# Patient Record
Sex: Male | Born: 1990 | Race: White | Hispanic: No | Smoking: Never smoker
Health system: Southern US, Community
[De-identification: ages and names within clinical notes are randomized; demographics above are authoritative.]

## PROBLEM LIST (undated history)

## (undated) DIAGNOSIS — J039 Acute tonsillitis, unspecified: Secondary | ICD-10-CM

## (undated) DIAGNOSIS — F909 Attention-deficit hyperactivity disorder, unspecified type: Secondary | ICD-10-CM

## (undated) DIAGNOSIS — G43909 Migraine, unspecified, not intractable, without status migrainosus: Secondary | ICD-10-CM

## (undated) DIAGNOSIS — M549 Dorsalgia, unspecified: Secondary | ICD-10-CM

## (undated) HISTORY — PX: APPENDECTOMY: SHX54

---

## 2011-11-29 DIAGNOSIS — R319 Hematuria, unspecified: Secondary | ICD-10-CM | POA: Insufficient documentation

## 2013-02-15 DIAGNOSIS — K37 Unspecified appendicitis: Secondary | ICD-10-CM | POA: Insufficient documentation

## 2013-10-11 DIAGNOSIS — M545 Low back pain, unspecified: Secondary | ICD-10-CM | POA: Insufficient documentation

## 2013-10-12 DIAGNOSIS — M5137 Other intervertebral disc degeneration, lumbosacral region: Secondary | ICD-10-CM | POA: Insufficient documentation

## 2019-07-29 ENCOUNTER — Other Ambulatory Visit: Payer: Self-pay

## 2019-07-29 ENCOUNTER — Ambulatory Visit (HOSPITAL_COMMUNITY)
Admission: EM | Admit: 2019-07-29 | Discharge: 2019-07-29 | Disposition: A | Discharge status: Home / Self Care | Payer: Managed Care, Other (non HMO) | Attending: Internal Medicine | Admitting: Internal Medicine

## 2019-07-29 ENCOUNTER — Encounter (HOSPITAL_COMMUNITY): Payer: Self-pay

## 2019-07-29 DIAGNOSIS — R0789 Other chest pain: Secondary | ICD-10-CM

## 2019-07-29 DIAGNOSIS — S238XXA Sprain of other specified parts of thorax, initial encounter: Secondary | ICD-10-CM

## 2019-07-29 DIAGNOSIS — X500XXA Overexertion from strenuous movement or load, initial encounter: Secondary | ICD-10-CM

## 2019-07-29 MED ORDER — CYCLOBENZAPRINE HCL 10 MG PO TABS: 10.0000 mg | ORAL_TABLET | Freq: Two times a day (BID) | ORAL | 0 refills | Status: DC | PRN

## 2019-07-29 MED ORDER — KETOROLAC TROMETHAMINE 60 MG/2ML IM SOLN
INTRAMUSCULAR | Status: AC
  Filled 2019-07-29: qty 2

## 2019-07-29 MED ORDER — HYDROCODONE-ACETAMINOPHEN 5-325 MG PO TABS: 1.0000 | ORAL_TABLET | Freq: Four times a day (QID) | ORAL | 0 refills | Status: AC | PRN

## 2019-07-29 NOTE — ED Triage Notes (Signed)
Pt states she was moving some stuff in his garage and he started to feel chest pains. The pain started inside of his right underarm and moved into his back and his chest this started yesterday.

## 2019-07-29 NOTE — ED Provider Notes (Addendum)
[facility]    CSN: [id number] Arrival date & time: 07/29/19  1528      History   Chief Complaint Chief Complaint  Patient presents with  . Chest Pain    HPI Eric Cowan is a 28 y.o. male comes to urgent care with 1 day history of right-sided chest pain.  Patient carried her deep freezer last night into his house.  He had slight right arm pain.  Over the course of the night and for the most part of the day the pain worsened in intensity.  Pain is currently 10 out of 10, sharp, constant, no relieving factors.  It is aggravated by movement.  Pain radiates around the neck on the right side as well as around the back.  He has not tried any over-the-counter medications. Marland Kitchen   HPI  History reviewed. No pertinent past medical history.  There are no problems to display for this patient.   History reviewed. No pertinent surgical history.     Home Medications    Prior to Admission medications   Medication Sig Start Date End Date Taking? Authorizing Provider  cyclobenzaprine (FLEXERIL) 10 MG tablet Take 1 tablet (10 mg total) by mouth 2 (two) times daily as needed for muscle spasms. 07/29/19   LampteyMyrene Galas, MD  HYDROcodone-acetaminophen (NORCO/VICODIN) 5-325 MG tablet Take 1 tablet by mouth every 6 (six) hours as needed for up to 5 days for moderate pain or severe pain. 07/29/19 08/03/19  Chase Picket, MD    Family History History reviewed. No pertinent family history.  Social History Social History   Tobacco Use  . Smoking status: Never Smoker  . Smokeless tobacco: Never Used  Substance Use Topics  . Alcohol use: Yes  . Drug use: Never     Allergies   Patient has no known allergies.   Review of Systems Review of Systems  Constitutional: Positive for activity change. Negative for chills, fatigue and fever.  HENT: Negative.   Eyes: Negative.   Respiratory: Positive for shortness of breath. Negative for cough and chest tightness.     Cardiovascular: Positive for chest pain. Negative for palpitations.  Gastrointestinal: Negative for nausea and vomiting.  Musculoskeletal: Positive for arthralgias, myalgias and neck pain. Negative for neck stiffness.  Skin: Negative.  Negative for rash and wound.  Neurological: Negative for dizziness, light-headedness and headaches.  Psychiatric/Behavioral: Negative for confusion and decreased concentration.     Physical Exam Triage Vital Signs ED Triage Vitals  Enc Vitals Group     BP 07/29/19 1609 (!) 144/116     Pulse Rate 07/29/19 1609 (!) 128     Resp 07/29/19 1609 18     Temp 07/29/19 1609 98.9 F (37.2 C)     Temp Source 07/29/19 1609 Oral     SpO2 07/29/19 1609 100 %     Weight 07/29/19 1604 234 lb (106.1 kg)     Height --      Head Circumference --      Peak Flow --      Pain Score 07/29/19 1603 10     Pain Loc --      Pain Edu? --      Excl. in [place]? --    No data found.  Updated Vital Signs BP (!) 144/116 (BP Location: Right Arm)   Pulse (!) 128   Temp 98.9 F (37.2 C) (Oral)   Resp 18   Wt 106.1 kg   SpO2 100%   Visual  Acuity Right Eye Distance:   Left Eye Distance:   Bilateral Distance:    Right Eye Near:   Left Eye Near:    Bilateral Near:     Physical Exam Vitals and nursing note reviewed.  Constitutional:      General: He is in acute distress.     Appearance: He is not ill-appearing or diaphoretic.  HENT:     Head: Normocephalic and atraumatic.  Neck:     Vascular: No hepatojugular reflux.     Trachea: No tracheal deviation.  Cardiovascular:     Rate and Rhythm: Normal rate and regular rhythm.  Pulmonary:     Effort: No tachypnea or respiratory distress.     Breath sounds: Normal breath sounds. No decreased breath sounds, wheezing, rhonchi or rales.  Abdominal:     General: Bowel sounds are normal. There is no abdominal bruit.     Palpations: There is no hepatomegaly or splenomegaly.  Musculoskeletal:        General: Normal range of  motion.     Cervical back: Normal range of motion.     Right lower leg: No edema.     Left lower leg: No edema.  Lymphadenopathy:     Cervical: No cervical adenopathy.  Skin:    General: Skin is warm.     Capillary Refill: Capillary refill takes less than 2 seconds.     Coloration: Skin is not cyanotic or pale.  Neurological:     General: No focal deficit present.     Mental Status: He is alert.      UC Treatments / Results  Labs (all labs ordered are listed, but only abnormal results are displayed) Labs Reviewed - No data to display  EKG   Radiology No results found.  Procedures Procedures (including critical care time)  Medications Ordered in UC Medications - No data to display  Initial Impression / Assessment and Plan / UC Course  I have reviewed the triage vital signs and the nursing notes.  Pertinent labs & imaging results that were available during my care of the patient were reviewed by me and considered in my medical decision making (see chart for details).     1.  Chest wall sprain: Patient sprained his pectoralis muscle from the heavy lifting. Toradol 60 mg IM x1 dose Hydrocodone 5-325 mg 1 tablet every 6 hours as needed for pain Flexeril 10 mg twice daily as needed for muscle spasms Icing of the pectoralis muscle Gentle range of motion exercises If symptoms worsen patient is welcome to return to urgent care to be evaluated.  2.  Elevated blood pressure secondary to severe pain: Pain control as above Repeat blood pressure was 135/109. Final Clinical Impressions(s) / UC Diagnoses   Final diagnoses:  Sprain of chest wall, initial encounter   Discharge Instructions   None    ED Prescriptions    Medication Sig Dispense Auth. Provider   cyclobenzaprine (FLEXERIL) 10 MG tablet Take 1 tablet (10 mg total) by mouth 2 (two) times daily as needed for muscle spasms. 30 tablet Juston Goheen, Britta Mccreedy, MD   HYDROcodone-acetaminophen (NORCO/VICODIN) 5-325 MG  tablet Take 1 tablet by mouth every 6 (six) hours as needed for up to 5 days for moderate pain or severe pain. 20 tablet Yocelyn Brocious, Britta Mccreedy, MD     I have reviewed the PDMP during this encounter.   Merrilee Jansky, MD 07/29/19 1719    Merrilee Jansky, MD 07/29/19 [id number]

## 2019-08-02 ENCOUNTER — Ambulatory Visit: Payer: Managed Care, Other (non HMO) | Attending: Internal Medicine

## 2019-08-02 DIAGNOSIS — Z20822 Contact with and (suspected) exposure to covid-19: Secondary | ICD-10-CM

## 2019-08-03 ENCOUNTER — Other Ambulatory Visit: Payer: Managed Care, Other (non HMO)

## 2019-08-03 LAB — NOVEL CORONAVIRUS, NAA: SARS-CoV-2, NAA: DETECTED — AB

## 2019-09-22 DIAGNOSIS — J039 Acute tonsillitis, unspecified: Secondary | ICD-10-CM | POA: Insufficient documentation

## 2019-09-27 ENCOUNTER — Ambulatory Visit (INDEPENDENT_AMBULATORY_CARE_PROVIDER_SITE_OTHER): Payer: Managed Care, Other (non HMO)

## 2019-09-27 ENCOUNTER — Ambulatory Visit (HOSPITAL_COMMUNITY)
Admission: EM | Admit: 2019-09-27 | Discharge: 2019-09-27 | Disposition: A | Discharge status: Home / Self Care | Payer: Managed Care, Other (non HMO) | Attending: Family Medicine | Admitting: Family Medicine

## 2019-09-27 ENCOUNTER — Encounter (HOSPITAL_COMMUNITY): Payer: Self-pay

## 2019-09-27 ENCOUNTER — Other Ambulatory Visit: Payer: Self-pay

## 2019-09-27 DIAGNOSIS — S93402A Sprain of unspecified ligament of left ankle, initial encounter: Secondary | ICD-10-CM

## 2019-09-27 HISTORY — DX: Acute tonsillitis, unspecified: J03.90

## 2019-09-27 MED ORDER — IBUPROFEN 800 MG PO TABS
800.0000 mg | ORAL_TABLET | Freq: Once | ORAL | Status: AC
  Administered 2019-09-27: 800 mg via ORAL

## 2019-09-27 MED ORDER — IBUPROFEN 800 MG PO TABS
ORAL_TABLET | ORAL | Status: AC
  Filled 2019-09-27: qty 1

## 2019-09-27 NOTE — ED Triage Notes (Signed)
Pt states while walking, his foot slipped off sidewalk into drainage ditch area and he twisted his left ankle, fell and heard a "snap and pop" in his ankle. Abrasion to right hand and knee. Left ankle edematous with limited ROM.

## 2019-09-27 NOTE — ED Provider Notes (Signed)
[facility]    CSN: [id number] Arrival date & time: 09/27/19  1300      History   Chief Complaint Chief Complaint  Patient presents with  . Ankle Pain    HPI Eric Cowan is a 29 y.o. male no significant past medical history presenting today for evaluation of left ankle injury.  Patient was walking, stepped off of a curb and twisted his left ankle.  Felt a popping sensation at the time.  Injury caused him to fall and he landed on his right knee and hand.  He sustained abrasions to these areas, but denies any difficulty bending knee or any wrist pain.  His main concern is his left ankle, has developed significant pain with weightbearing as well as swelling.  Accident happened earlier today.  Denies prior injury to ankle/foot.  Does have slight tingling sensation.  Reports limited range of motion.  HPI  Past Medical History:  Diagnosis Date  . Tonsillitis     There are no problems to display for this patient.   History reviewed. No pertinent surgical history.     Home Medications    Prior to Admission medications   Medication Sig Start Date End Date Taking? Authorizing Provider  atorvastatin (LIPITOR) 10 MG tablet Take by mouth. 02/09/19  Yes [provider]  clindamycin (CLEOCIN) 150 MG capsule  09/23/19  Yes [provider]  predniSONE (STERAPRED UNI-PAK 21 TAB) 5 MG (21) TBPK tablet USE AS DIRECTED PER PACKAGE INSTRUCTIONS 08/02/19  Yes [provider]    Family History History reviewed. No pertinent family history.  Social History Social History   Tobacco Use  . Smoking status: Never Smoker  . Smokeless tobacco: Never Used  Substance Use Topics  . Alcohol use: Yes  . Drug use: Never     Allergies   Patient has no known allergies.   Review of Systems Review of Systems  Constitutional: Negative for fatigue and fever.  Eyes: Negative for redness, itching and visual disturbance.  Respiratory: Negative for  shortness of breath.   Cardiovascular: Negative for chest pain and leg swelling.  Gastrointestinal: Negative for nausea and vomiting.  Musculoskeletal: Positive for arthralgias, gait problem and joint swelling. Negative for myalgias.  Skin: Positive for wound. Negative for color change and rash.  Neurological: Negative for dizziness, syncope, weakness, light-headedness and headaches.     Physical Exam Triage Vital Signs ED Triage Vitals  Enc Vitals Group     BP 09/27/19 1357 123/86     Pulse Rate 09/27/19 1357 90     Resp 09/27/19 1357 18     Temp 09/27/19 1357 98.2 F (36.8 C)     Temp Source 09/27/19 1357 Oral     SpO2 09/27/19 1357 98 %     Weight --      Height --      Head Circumference --      Peak Flow --      Pain Score 09/27/19 1353 6     Pain Loc --      Pain Edu? --      Excl. in [place]? --    No data found.  Updated Vital Signs BP 123/86 (BP Location: Right Arm)   Pulse 90   Temp 98.2 F (36.8 C) (Oral)   Resp 18   SpO2 98%   Visual Acuity Right Eye Distance:   Left Eye Distance:   Bilateral Distance:    Right Eye Near:   Left Eye Near:  Bilateral Near:     Physical Exam Vitals and nursing note reviewed.  Constitutional:      Appearance: He is well-developed.     Comments: No acute distress  HENT:     Head: Normocephalic and atraumatic.     Nose: Nose normal.  Eyes:     Conjunctiva/sclera: Conjunctivae normal.  Cardiovascular:     Rate and Rhythm: Normal rate.  Pulmonary:     Effort: Pulmonary effort is normal. No respiratory distress.  Abdominal:     General: There is no distension.  Musculoskeletal:        General: Normal range of motion.     Cervical back: Neck supple.     Comments: Left ankle: Moderate swelling about lateral malleolus extending anteriorly and to proximal lateral dorsum of foot Nontender to medial malleolus, minimal tenderness over Achilles, negative Thompson's Nontender to palpation of distal dorsum of foot along the  distal metatarsals, dorsalis pedis 2+ Limited dorsiflexion, relatively full plantar flexion Nontender to palpation of fibular insertion at the left knee  Skin:    General: Skin is warm and dry.     Comments: Superficial abrasions noted to right knee and right palm/wrist  Neurological:     Mental Status: He is alert and oriented to person, place, and time.      UC Treatments / Results  Labs (all labs ordered are listed, but only abnormal results are displayed) Labs Reviewed - No data to display  EKG   Radiology DG Ankle Complete Left  Result Date: 09/27/2019 CLINICAL DATA:  Pain and swelling. Painful range of motion. The patient suffered a twisting injury today. EXAM: LEFT ANKLE COMPLETE - 3+ VIEW COMPARISON:  None. FINDINGS: There is no evidence of fracture, dislocation, or joint effusion. There is no evidence of arthropathy or other focal bone abnormality. Slight soft tissue swelling over the lateral malleolus. IMPRESSION: Soft tissue swelling over the lateral malleolus. Electronically Signed   By: Lorriane Shire M.D.   On: 09/27/2019 14:25    Procedures Procedures (including critical care time)  Medications Ordered in UC Medications  ibuprofen (ADVIL) tablet 800 mg (800 mg Oral Given 09/27/19 1414)    Initial Impression / Assessment and Plan / UC Course  I have reviewed the triage vital signs and the nursing notes.  Pertinent labs & imaging results that were available during my care of the patient were reviewed by me and considered in my medical decision making (see chart for details).    1.  Left ankle sprain X-ray negative for acute fracture.  Most likely sprain, but discussed with patient cannot rule out underlying ligamentous/tendon damage.  Will treat as sprain with ASO, crutches, weight-bear as tolerated, anti-inflammatories ice and elevation.  If not regaining normal range of motion and improvement in pain and swelling over the next couple weeks to follow-up with sports  medicine.  Discussed strict return precautions. Patient verbalized understanding and is agreeable with plan.  Final Clinical Impressions(s) / UC Diagnoses   Final diagnoses:  Sprain of left ankle, unspecified ligament, initial encounter     Discharge Instructions     No fracture Ice and elevate Use anti-inflammatories for pain/swelling. You may take up to 800 mg Ibuprofen every 8 hours with food. You may supplement Ibuprofen with Tylenol [phone] mg every 8 hours.   Weight bear as tolerated If not having any improvement in pain/swelling and ROM in 1-2 weeks follow up with sports medicine    ED Prescriptions    None  PDMP not reviewed this encounter.   Lew Dawes, PA-C 09/27/19 1606

## 2019-09-27 NOTE — Discharge Instructions (Addendum)
No fracture Ice and elevate Use anti-inflammatories for pain/swelling. You may take up to 800 mg Ibuprofen every 8 hours with food. You may supplement Ibuprofen with Tylenol [phone] mg every 8 hours.   Weight bear as tolerated If not having any improvement in pain/swelling and ROM in 1-2 weeks follow up with sports medicine

## 2019-11-01 ENCOUNTER — Other Ambulatory Visit: Payer: Self-pay

## 2019-11-01 ENCOUNTER — Encounter: Payer: Self-pay | Admitting: Family Medicine

## 2019-11-01 ENCOUNTER — Ambulatory Visit: Payer: Managed Care, Other (non HMO) | Admitting: Family Medicine

## 2019-11-01 DIAGNOSIS — S93492A Sprain of other ligament of left ankle, initial encounter: Secondary | ICD-10-CM | POA: Diagnosis not present

## 2019-11-01 DIAGNOSIS — S93402A Sprain of unspecified ligament of left ankle, initial encounter: Secondary | ICD-10-CM | POA: Insufficient documentation

## 2019-11-01 NOTE — Patient Instructions (Signed)
You have an ankle sprain and strained your peroneus longus. Ice the area for 15 minutes at a time, 3-4 times a day Aleve 2 tabs twice a day with food OR ibuprofen 3 tabs three times a day with food for pain and inflammation as needed at this point. Elevate above the level of your heart when possible Use laceup brace when up and walking around to help with stability while you recover from this injury. Start theraband strengthening exercises and balance exercises - once a day 3 sets of 10. Consider physical therapy for strengthening and balance exercises. Follow up in 1 month (or as needed if you're doing great).

## 2019-11-01 NOTE — Addendum Note (Signed)
Addended by: Lenda Kelp on: 11/01/2019 04:08 PM   Modules accepted: Orders

## 2019-11-01 NOTE — Assessment & Plan Note (Signed)
Appears to be healing.  Patient was encouraged to continue wearing his brace during most activities and to ice the area more frequently.  He was also given home PT exercises to complete and encouraged to use ibuprofen or Aleve with food for pain relief.  He can follow-up in 4 weeks or as needed.

## 2019-11-01 NOTE — Progress Notes (Addendum)
    SUBJECTIVE:   CHIEF COMPLAINT / HPI:   Follow-up of left ankle sprain Patient reports that 1 month ago, he twisted his ankle while stepping off of a curb.  He was seen at urgent care at that time, and they provided him with a lace up brace and performed x-rays, which were normal.  He has been wearing his brace when he is more active and will ice the area occasionally.  He says that the swelling and pain have improved, although they are still present.  His pain is exacerbated by climbing the stairs or any other time where he transfers his weight from his heel to the ball of his foot.  Pain is located in the lateral ankle.  PERTINENT  PMH / PSH: none  OBJECTIVE:   BP 120/80   Ht 6\' 1"  (1.854 m)   Wt 241 lb (109.3 kg)   BMI 31.80 kg/m   General: well appearing, appears stated age Left ankle: Mild swelling posterior to the lateral malleolus visualized on inspection, but no ecchymosis.  Tender to palpation of the region surrounding the lateral malleolus including over ATFL and peroneal tendons.  Pain elicited on dorsiflexion.  ROM limited on dorsiflexion and plantar flexion, inversion and eversion.  5/5 strength on dorsiflexion, plantarflexion, inversion, and eversion, although pain is present during these movements.  Brief MSK u/s left ankle:  No ankle effusion.  ATFL irregular consistent with tear.  Peroneus brevis is normal.  Peroneus longus with increased fluid surrounding tendon but no visualized tear.  ASSESSMENT/PLAN:   Sprain of left ankle Appears to be healing well.  Patient was encouraged to continue wearing his brace during most activities and to ice the area more frequently.  He was also given home PT exercises to complete and encouraged to use ibuprofen or Aleve with food for pain relief.  He can follow-up in 4 weeks or as needed.     , MD [facility]

## 2019-12-06 ENCOUNTER — Ambulatory Visit: Payer: Managed Care, Other (non HMO) | Admitting: Family Medicine

## 2019-12-11 ENCOUNTER — Ambulatory Visit: Payer: Managed Care, Other (non HMO) | Attending: Internal Medicine

## 2019-12-11 DIAGNOSIS — Z23 Encounter for immunization: Secondary | ICD-10-CM

## 2019-12-11 NOTE — Progress Notes (Signed)
   Covid-19 Vaccination Clinic  Name:  Eric Cowan    MRN: [id number] DOB: 1990/12/04  12/11/2019  [name] was observed post Covid-19 immunization for 15 minutes without incident. He was provided with Vaccine Information Sheet and instruction to access the V-Safe system.   [name] was instructed to call 911 with any severe reactions post vaccine: Marland Kitchen Difficulty breathing  . Swelling of face and throat  . A fast heartbeat  . A bad rash all over body  . Dizziness and weakness   Immunizations Administered    Name Date Dose VIS Date Route   Pfizer COVID-19 Vaccine 12/11/2019  9:48 AM 0.3 mL 09/22/2018 Intramuscular   Manufacturer: [employer], [employer]   Lot: [id number]   NDC: [address]-4

## 2020-01-03 ENCOUNTER — Ambulatory Visit: Payer: Managed Care, Other (non HMO) | Attending: Internal Medicine

## 2020-01-03 DIAGNOSIS — Z23 Encounter for immunization: Secondary | ICD-10-CM

## 2020-01-03 NOTE — Progress Notes (Signed)
   Covid-19 Vaccination Clinic  Name:  Eric Cowan    MRN: [id number] DOB: 01-14-1991  01/03/2020  Mr. Eric Cowan was observed post Covid-19 immunization for 15 minutes without incident. He was provided with Vaccine Information Sheet and instruction to access the V-Safe system.   Mr. Eric Cowan was instructed to call 911 with any severe reactions post vaccine: Marland Kitchen Difficulty breathing  . Swelling of face and throat  . A fast heartbeat  . A bad rash all over body  . Dizziness and weakness   Immunizations Administered    Name Date Dose VIS Date Route   Pfizer COVID-19 Vaccine 01/03/2020  4:21 PM 0.3 mL 09/22/2018 Intramuscular   Manufacturer: [employer], [employer]   Lot: [id number]   NDC: [address]-0

## 2020-02-07 ENCOUNTER — Other Ambulatory Visit: Payer: Self-pay

## 2020-02-07 ENCOUNTER — Encounter (HOSPITAL_COMMUNITY): Payer: Self-pay | Admitting: Emergency Medicine

## 2020-02-07 ENCOUNTER — Ambulatory Visit (HOSPITAL_COMMUNITY)
Admission: EM | Admit: 2020-02-07 | Discharge: 2020-02-07 | Disposition: A | Discharge status: Home / Self Care | Payer: Managed Care, Other (non HMO) | Attending: Physician Assistant | Admitting: Physician Assistant

## 2020-02-07 DIAGNOSIS — R197 Diarrhea, unspecified: Secondary | ICD-10-CM | POA: Diagnosis not present

## 2020-02-07 DIAGNOSIS — Z79899 Other long term (current) drug therapy: Secondary | ICD-10-CM | POA: Diagnosis not present

## 2020-02-07 DIAGNOSIS — R112 Nausea with vomiting, unspecified: Secondary | ICD-10-CM | POA: Diagnosis not present

## 2020-02-07 DIAGNOSIS — Z20822 Contact with and (suspected) exposure to covid-19: Secondary | ICD-10-CM | POA: Insufficient documentation

## 2020-02-07 MED ORDER — ONDANSETRON 4 MG PO TBDP
ORAL_TABLET | ORAL | Status: AC
  Filled 2020-02-07: qty 1

## 2020-02-07 MED ORDER — ACETAMINOPHEN 325 MG PO TABS
ORAL_TABLET | ORAL | Status: AC
  Filled 2020-02-07: qty 2

## 2020-02-07 MED ORDER — ONDANSETRON 4 MG PO TBDP: 4.0000 mg | ORAL_TABLET | Freq: Three times a day (TID) | ORAL | 0 refills | Status: DC | PRN

## 2020-02-07 MED ORDER — ACETAMINOPHEN 325 MG PO TABS
650.0000 mg | ORAL_TABLET | Freq: Once | ORAL | Status: AC
  Administered 2020-02-07: 650 mg via ORAL

## 2020-02-07 MED ORDER — ONDANSETRON 4 MG PO TBDP
4.0000 mg | ORAL_TABLET | Freq: Once | ORAL | Status: AC
  Administered 2020-02-07: 4 mg via ORAL

## 2020-02-07 NOTE — ED Triage Notes (Signed)
At 1:00 am started with a headache.  Since then has had nausea, vomiting, and diarrhea.  Patient says he has not urinated since 10-10:30 pm last night.    States he has vomited 7 times, 4 diarrhea stools

## 2020-02-07 NOTE — Discharge Instructions (Signed)
Return if any problems.

## 2020-02-07 NOTE — ED Provider Notes (Signed)
[facility]    CSN: [id number] Arrival date & time: 02/07/20  1356      History   Chief Complaint Chief Complaint  Patient presents with  . Vomiting    HPI Eric Cowan is a 29 y.o. male.   The history is provided by the patient. No language interpreter was used.  Emesis Severity:  Severe Duration:  1 day Timing:  Constant Number of daily episodes:  Mulyiple Able to tolerate:  Liquids and solids Progression:  Unchanged Chronicity:  New Relieved by:  Nothing Ineffective treatments:  None tried Associated symptoms: no abdominal pain   Risk factors: alcohol use   Risk factors: no sick contacts     Past Medical History:  Diagnosis Date  . Tonsillitis     Patient Active Problem List   Diagnosis Date Noted  . Sprain of left ankle 11/01/2019    History reviewed. No pertinent surgical history.     Home Medications    Prior to Admission medications   Medication Sig Start Date End Date Taking? Authorizing Provider  atorvastatin (LIPITOR) 10 MG tablet Take by mouth. 02/09/19   [provider]  ondansetron (ZOFRAN ODT) 4 MG disintegrating tablet Take 1 tablet (4 mg total) by mouth every 8 (eight) hours as needed for nausea or vomiting. 02/07/20   Elson Areas, PA-C    Family History History reviewed. No pertinent family history.  Social History Social History   Tobacco Use  . Smoking status: Never Smoker  . Smokeless tobacco: Never Used  Substance Use Topics  . Alcohol use: Yes  . Drug use: Never     Allergies   Patient has no known allergies.   Review of Systems Review of Systems  Gastrointestinal: Positive for vomiting. Negative for abdominal pain.  All other systems reviewed and are negative.    Physical Exam Triage Vital Signs ED Triage Vitals  Enc Vitals Group     BP 02/07/20 1613 126/76     Pulse Rate 02/07/20 1613 82     Resp 02/07/20 1613 (!) 22     Temp 02/07/20 1613 98.5 F (36.9 C)     Temp  Source 02/07/20 1613 Oral     SpO2 02/07/20 1613 100 %     Weight --      Height --      Head Circumference --      Peak Flow --      Pain Score 02/07/20 1610 7     Pain Loc --      Pain Edu? --      Excl. in [place]? --    No data found.  Updated Vital Signs BP 126/76 (BP Location: Left Arm)   Pulse 82   Temp 98.5 F (36.9 C) (Oral)   Resp (!) 22   SpO2 100%   Visual Acuity Right Eye Distance:   Left Eye Distance:   Bilateral Distance:    Right Eye Near:   Left Eye Near:    Bilateral Near:     Physical Exam Vitals and nursing note reviewed.  Constitutional:      Appearance: He is well-developed.  HENT:     Head: Normocephalic.     Right Ear: Tympanic membrane normal.     Left Ear: Tympanic membrane normal.  Cardiovascular:     Rate and Rhythm: Normal rate.  Pulmonary:     Effort: Pulmonary effort is normal.  Abdominal:     General: Abdomen is flat. There is  no distension.     Palpations: Abdomen is soft.  Musculoskeletal:        General: Normal range of motion.     Cervical back: Normal range of motion.  Skin:    General: Skin is warm.  Neurological:     General: No focal deficit present.     Mental Status: He is alert and oriented to person, place, and time.  Psychiatric:        Mood and Affect: Mood normal.      UC Treatments / Results  Labs (all labs ordered are listed, but only abnormal results are displayed) Labs Reviewed  SARS CORONAVIRUS 2 (TAT 6-24 HRS)    EKG   Radiology No results found.  Procedures Procedures (including critical care time)  Medications Ordered in UC Medications  ondansetron (ZOFRAN-ODT) disintegrating tablet 4 mg (4 mg Oral Given 02/07/20 1638)  acetaminophen (TYLENOL) tablet 650 mg (650 mg Oral Given 02/07/20 1641)    Initial Impression / Assessment and Plan / UC Course  I have reviewed the triage vital signs and the nursing notes.  Pertinent labs & imaging results that were available during my care of the  patient were reviewed by me and considered in my medical decision making (see chart for details).     MDM:  Pt given zofran odt and tylenol  Pt adble to tolerate water, given gatoraid  Pt able to tolerate.  Rx for zofran  Final Clinical Impressions(s) / UC Diagnoses   Final diagnoses:  Non-intractable vomiting with nausea, unspecified vomiting type  Diarrhea, unspecified type     Discharge Instructions     Return if any problems.    ED Prescriptions    Medication Sig Dispense Auth. Provider   ondansetron (ZOFRAN ODT) 4 MG disintegrating tablet Take 1 tablet (4 mg total) by mouth every 8 (eight) hours as needed for nausea or vomiting. 20 tablet Elson Areas, [place]     PDMP not reviewed this encounter.  An After Visit Summary was printed and given to the patient.   Elson Areas, [place] 02/07/20 1739

## 2020-02-07 NOTE — ED Notes (Signed)
covid specimen in lab 

## 2020-02-08 LAB — SARS CORONAVIRUS 2 (TAT 6-24 HRS): SARS Coronavirus 2: NEGATIVE

## 2020-03-13 ENCOUNTER — Ambulatory Visit (INDEPENDENT_AMBULATORY_CARE_PROVIDER_SITE_OTHER): Payer: Managed Care, Other (non HMO)

## 2020-03-13 ENCOUNTER — Encounter: Payer: Self-pay | Admitting: Podiatry

## 2020-03-13 ENCOUNTER — Other Ambulatory Visit: Payer: Self-pay

## 2020-03-13 ENCOUNTER — Ambulatory Visit: Payer: Managed Care, Other (non HMO) | Admitting: Podiatry

## 2020-03-13 ENCOUNTER — Ambulatory Visit (INDEPENDENT_AMBULATORY_CARE_PROVIDER_SITE_OTHER): Payer: Managed Care, Other (non HMO) | Admitting: Orthotics

## 2020-03-13 VITALS — Temp 98.6°F

## 2020-03-13 DIAGNOSIS — M722 Plantar fascial fibromatosis: Secondary | ICD-10-CM

## 2020-03-13 DIAGNOSIS — K469 Unspecified abdominal hernia without obstruction or gangrene: Secondary | ICD-10-CM | POA: Insufficient documentation

## 2020-03-13 DIAGNOSIS — IMO0001 Reserved for inherently not codable concepts without codable children: Secondary | ICD-10-CM | POA: Insufficient documentation

## 2020-03-13 MED ORDER — METHYLPREDNISOLONE 4 MG PO TBPK: ORAL_TABLET | ORAL | 0 refills | Status: DC

## 2020-03-13 MED ORDER — MELOXICAM 15 MG PO TABS: 15.0000 mg | ORAL_TABLET | Freq: Every day | ORAL | 1 refills | Status: AC

## 2020-03-13 NOTE — Patient Instructions (Signed)

## 2020-03-13 NOTE — Progress Notes (Signed)

## 2020-03-13 NOTE — Progress Notes (Signed)
   Subjective: 29 y.o. male presenting as a new patient for evaluation of bilateral heel pain is been going on for approximately 10 years now.  Patient states that when he was in high school and into college he used to receive steroidal injections and oral anti-inflammatory pills as well as were custom orthotics.  He is tried multiple conservative modalities over the past 10 years with minimal relief.  Recently he has not been treating his foot for approximately 8-10 years now.  He is very active on his feet both at work and recreational and he presents for further treatment evaluation   Past Medical History:  Diagnosis Date  . Tonsillitis      Objective: Physical Exam General: The patient is alert and oriented x3 in no acute distress.  Dermatology: Skin is warm, dry and supple bilateral lower extremities. Negative for open lesions or macerations bilateral.   Vascular: Dorsalis Pedis and Posterior Tibial pulses palpable bilateral.  Capillary fill time is immediate to all digits.  Neurological: Epicritic and protective threshold intact bilateral.   Musculoskeletal: Tenderness to palpation to the plantar aspect of the bilateral heels along the plantar fascia. All other joints range of motion within normal limits bilateral. Strength 5/5 in all groups bilateral.   Radiographic exam: Normal osseous mineralization. Joint spaces preserved. No fracture/dislocation/boney destruction. No other soft tissue abnormalities or radiopaque foreign bodies.   Assessment: 1. plantar fasciitis bilateral feet  Plan of Care:  1. Patient evaluated. Xrays reviewed.   2. Injection of 0.5cc Celestone soluspan injected into the bilateral heels.  3. Rx for Medrol Dose Pak placed 4. Rx for Meloxicam ordered for patient. 5. Plantar fascial band(s) dispensed for bilateral plantar fasciitis. 6. Instructed patient regarding therapies and modalities at home to alleviate symptoms.  7.  Appointment with with  Pedorthist for custom molded orthotics  8.  Return to clinic in 4 weeks.  *Works at [employer] for [employer]  Felecia Shelling, [facility]  Dr. Felecia Shelling, DPM    [address] [place], [place] [address]                Office [phone]  Fax [phone]

## 2020-04-17 ENCOUNTER — Ambulatory Visit: Payer: Managed Care, Other (non HMO) | Admitting: Podiatry

## 2020-04-17 ENCOUNTER — Other Ambulatory Visit: Payer: Managed Care, Other (non HMO) | Admitting: Orthotics

## 2020-04-17 ENCOUNTER — Other Ambulatory Visit: Payer: Self-pay

## 2020-04-17 DIAGNOSIS — M722 Plantar fascial fibromatosis: Secondary | ICD-10-CM | POA: Diagnosis not present

## 2020-04-17 NOTE — Progress Notes (Signed)
   Subjective: 29 y.o. male presenting as a new patient for evaluation of bilateral heel pain is been going on for approximately 10 years now.  Patient states that when he was in high school and into college he used to receive steroidal injections and oral anti-inflammatory pills as well as were custom orthotics.  He is tried multiple conservative modalities over the past 10 years with minimal relief.  He is very active on his feet both at work and recreational   Patient states that the injections he received on 03/13/2020 only helped for a day or 2.  He continues to have chronic pain.  He does have history of acute nephritis/kidney infections and so he tries to limit his NSAIDs.  He states that the prednisone pack and meloxicam helped minimally.  He really enjoyed wearing the plantar fascial braces and he states that they help support his foot significantly.  He also has an appointment with Pedorthist today for dispersing the custom molded orthotics.   Past Medical History:  Diagnosis Date  . Tonsillitis      Objective: Physical Exam General: The patient is alert and oriented x3 in no acute distress.  Dermatology: Skin is warm, dry and supple bilateral lower extremities. Negative for open lesions or macerations bilateral.   Vascular: Dorsalis Pedis and Posterior Tibial pulses palpable bilateral.  Capillary fill time is immediate to all digits.  Neurological: Epicritic and protective threshold intact bilateral.   Musculoskeletal: Tenderness to palpation to the plantar aspect of the bilateral heels along the plantar fascia. All other joints range of motion within normal limits bilateral. Strength 5/5 in all groups bilateral.   Assessment: 1. plantar fasciitis bilateral feet  Plan of Care:  1. Patient evaluated.    2.  Continue meloxicam as needed. 3.  Continue plantar fascial braces as needed 4.  Patient has an appointment this afternoon with Pedorthist for orthotics dispersal.  Wear  daily 5.  Return to clinic as needed  *Works at [employer] for [employer]  Felecia Shelling, [facility]  Dr. Felecia Shelling, DPM    [address] [place], [place] [address]                Office [phone]  Fax [phone]

## 2020-10-07 ENCOUNTER — Ambulatory Visit (HOSPITAL_COMMUNITY)
Admission: EM | Admit: 2020-10-07 | Discharge: 2020-10-07 | Disposition: A | Discharge status: Home / Self Care | Payer: Managed Care, Other (non HMO)

## 2020-10-07 ENCOUNTER — Ambulatory Visit (INDEPENDENT_AMBULATORY_CARE_PROVIDER_SITE_OTHER): Payer: Managed Care, Other (non HMO)

## 2020-10-07 ENCOUNTER — Encounter (HOSPITAL_COMMUNITY): Payer: Self-pay | Admitting: *Deleted

## 2020-10-07 ENCOUNTER — Ambulatory Visit (HOSPITAL_COMMUNITY): Payer: Self-pay

## 2020-10-07 ENCOUNTER — Other Ambulatory Visit: Payer: Self-pay

## 2020-10-07 DIAGNOSIS — X500XXA Overexertion from strenuous movement or load, initial encounter: Secondary | ICD-10-CM | POA: Diagnosis not present

## 2020-10-07 DIAGNOSIS — M436 Torticollis: Secondary | ICD-10-CM | POA: Diagnosis not present

## 2020-10-07 DIAGNOSIS — M542 Cervicalgia: Secondary | ICD-10-CM | POA: Diagnosis not present

## 2020-10-07 HISTORY — DX: Dorsalgia, unspecified: M54.9

## 2020-10-07 HISTORY — DX: Attention-deficit hyperactivity disorder, unspecified type: F90.9

## 2020-10-07 NOTE — ED Provider Notes (Signed)
[facility]    CSN: [id number] Arrival date & time: 10/07/20  1010      History   Chief Complaint Chief Complaint  Patient presents with  . Neck Pain    HPI Eric Cowan is a 30 y.o. male.   Patient presents with 3-day history of left neck pain.  The pain started after he picked his sister up.  He states he heard a "pop" at the time.  He reports pain in his left neck muscles when he turns his head and intermittent tingling in his left arm to his fingers.  He denies numbness, weakness, or other symptoms.  His medical history includes DDD and low back pain.  The history is provided by the patient and medical records.    Past Medical History:  Diagnosis Date  . ADHD   . Back pain   . Tonsillitis     Patient Active Problem List   Diagnosis Date Noted  . Hernia 03/13/2020  . Other physical therapy 03/13/2020  . Sprain of left ankle 11/01/2019  . Tonsillitis 09/22/2019  . DDD (degenerative disc disease), lumbosacral 10/12/2013  . Low back pain 10/11/2013  . Appendicitis 02/15/2013  . Hematuria 11/29/2011    Past Surgical History:  Procedure Laterality Date  . APPENDECTOMY         Home Medications    Prior to Admission medications   Medication Sig Start Date End Date Taking? Authorizing Provider  Amphetamine-Dextroamphetamine (ADDERALL PO) Take by mouth.   Yes [provider]  Cyclobenzaprine HCl (FLEXERIL PO) Take by mouth.   Yes [provider]  HYDROCODONE-ACETAMINOPHEN PO Take by mouth.   Yes [provider]  atorvastatin (LIPITOR) 10 MG tablet Take by mouth. 02/09/19   [provider]  meloxicam (MOBIC) 15 MG tablet Take 1 tablet (15 mg total) by mouth daily. 03/13/20   Felecia Shelling, DPM  methylPREDNISolone (MEDROL DOSEPAK) 4 MG TBPK tablet 6 day dose pack - take as directed 03/13/20   Felecia Shelling, DPM    Family History Family History  Problem Relation Age of Onset  . Healthy Mother   . Healthy  Father     Social History Social History   Tobacco Use  . Smoking status: Never Smoker  . Smokeless tobacco: Never Used  Vaping Use  . Vaping Use: Never used  Substance Use Topics  . Alcohol use: Yes    Comment: occasionally  . Drug use: Never     Allergies   Patient has no known allergies.   Review of Systems Review of Systems  Constitutional: Negative for chills and fever.  HENT: Negative for ear pain and sore throat.   Eyes: Negative for pain and visual disturbance.  Respiratory: Negative for cough and shortness of breath.   Cardiovascular: Negative for chest pain and palpitations.  Gastrointestinal: Negative for abdominal pain and vomiting.  Genitourinary: Negative for dysuria and hematuria.  Musculoskeletal: Positive for neck pain. Negative for back pain.  Skin: Negative for color change and rash.  Neurological: Negative for syncope, weakness and numbness.  All other systems reviewed and are negative.    Physical Exam Triage Vital Signs ED Triage Vitals  Enc Vitals Group     BP      Pulse      Resp      Temp      Temp src      SpO2      Weight      Height  Head Circumference      Peak Flow      Pain Score      Pain Loc      Pain Edu?      Excl. in GC?    No data found.  Updated Vital Signs BP 126/81   Pulse 95   Temp 98.2 F (36.8 C) (Oral)   Resp (!) 24   SpO2 100%   Visual Acuity Right Eye Distance:   Left Eye Distance:   Bilateral Distance:    Right Eye Near:   Left Eye Near:    Bilateral Near:     Physical Exam Vitals and nursing note reviewed.  Constitutional:      General: He is not in acute distress.    Appearance: He is well-developed.  HENT:     Head: Normocephalic and atraumatic.     Mouth/Throat:     Mouth: Mucous membranes are moist.  Eyes:     Conjunctiva/sclera: Conjunctivae normal.  Cardiovascular:     Rate and Rhythm: Normal rate and regular rhythm.     Heart sounds: Normal heart sounds.  Pulmonary:      Effort: Pulmonary effort is normal. No respiratory distress.     Breath sounds: Normal breath sounds.  Abdominal:     Palpations: Abdomen is soft.     Tenderness: There is no abdominal tenderness.  Musculoskeletal:        General: Tenderness present. No swelling or deformity.     Cervical back: Neck supple.     Comments: Tenderness in left lateral neck from head to shoulder.  Limited neck ROM due to discomfort.  Upper extremity strength 5/5, sensation intact, 2+ pulses.  Skin:    General: Skin is warm and dry.     Findings: No bruising, erythema, lesion or rash.  Neurological:     General: No focal deficit present.     Mental Status: He is alert and oriented to person, place, and time.     Cranial Nerves: No cranial nerve deficit.     Sensory: No sensory deficit.     Motor: No weakness.     Gait: Gait normal.  Psychiatric:        Mood and Affect: Mood normal.        Behavior: Behavior normal.      UC Treatments / Results  Labs (all labs ordered are listed, but only abnormal results are displayed) Labs Reviewed - No data to display  EKG   Radiology DG Cervical Spine Complete  Result Date: 10/07/2020 CLINICAL DATA:  Pain after lifting heavy object EXAM: CERVICAL SPINE - COMPLETE 4+ VIEW COMPARISON:  None. FINDINGS: Frontal, lateral, open-mouth odontoid, and bilateral oblique views were obtained. There is cervical levoscoliosis. There is no fracture or spondylolisthesis. Prevertebral soft tissues and predental space regions are normal. There is moderate disc space narrowing at C4-5. Other disc spaces appear unremarkable. There is mild exit foraminal narrowing at C4-5 and C5-6 bilaterally due to facet hypertrophy. Lung apices are clear. 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 No fracture or spondylolisthesis. Mid cervical levoscoliosis potentially could be indicative of a degree of muscle spasm. Electronically Signed   By: Bretta Bang III M.D.   On: 10/07/2020  11:23    Procedures Procedures (including critical care time)  Medications Ordered in UC Medications - No data to display  Initial Impression / Assessment and Plan / UC Course  I have reviewed the triage vital signs and the nursing notes.  Pertinent labs &  imaging results that were available during my care of the patient were reviewed by me and considered in my medical decision making (see chart for details).   Acute torticollis.  X-ray shows "osteoarthritic change, primarily at C4-C5.  No fracture or spondylolisthesis.  Mid cervical levoscoliosis potentially could be indicative of a degree of muscle spasm."  Patient declines prescriptions for muscle relaxer or NSAID.  He states he already has cyclobenzaprine and ibuprofen at home.  Discussed other symptomatic relief including warm compresses and massage.  Instructed him to follow-up with orthopedics if his symptoms are not improving.  He agrees to plan of care.   Final Clinical Impressions(s) / UC Diagnoses   Final diagnoses:  Torticollis, acute     Discharge Instructions     Take the muscle relaxer and ibuprofen as directed.  Do not drive, operate machinery, or drink alcohol with a muscle relaxer as it may cause drowsiness.  Follow-up with an orthopedist if your symptoms are not improving.        ED Prescriptions    None     I have reviewed the PDMP during this encounter.   Mickie Bail, NP 10/07/20 1142

## 2020-10-07 NOTE — Discharge Instructions (Addendum)
Take the muscle relaxer and ibuprofen as directed.  Do not drive, operate machinery, or drink alcohol with a muscle relaxer as it may cause drowsiness.  Follow-up with an orthopedist if your symptoms are not improving.

## 2020-10-07 NOTE — ED Triage Notes (Signed)
C/O left lateral neck pain onset 3 days ago when lifting his adult sibling; states felt a "pop" in neck, since then continues with significant pain despite taking IBU, flexeril and hydrocodone (normally takes for back pain).  C/O intermittent parasthesias in LUE radiating into fingers.  Denies any LUE weakness.

## 2020-10-20 ENCOUNTER — Ambulatory Visit: Payer: Managed Care, Other (non HMO) | Admitting: Surgical

## 2021-01-10 ENCOUNTER — Encounter (HOSPITAL_COMMUNITY): Payer: Self-pay | Admitting: *Deleted

## 2021-01-10 ENCOUNTER — Other Ambulatory Visit: Payer: Self-pay

## 2021-01-10 ENCOUNTER — Emergency Department (HOSPITAL_COMMUNITY): Payer: No Typology Code available for payment source

## 2021-01-10 ENCOUNTER — Emergency Department (HOSPITAL_COMMUNITY)
Admission: EM | Admit: 2021-01-10 | Discharge: 2021-01-10 | Disposition: A | Discharge status: Home / Self Care | Payer: No Typology Code available for payment source | Attending: Emergency Medicine | Admitting: Emergency Medicine

## 2021-01-10 DIAGNOSIS — M25512 Pain in left shoulder: Secondary | ICD-10-CM | POA: Insufficient documentation

## 2021-01-10 DIAGNOSIS — M25511 Pain in right shoulder: Secondary | ICD-10-CM | POA: Diagnosis not present

## 2021-01-10 DIAGNOSIS — M546 Pain in thoracic spine: Secondary | ICD-10-CM | POA: Diagnosis not present

## 2021-01-10 DIAGNOSIS — W174XXA Fall from dock, initial encounter: Secondary | ICD-10-CM | POA: Diagnosis not present

## 2021-01-10 DIAGNOSIS — Y990 Civilian activity done for income or pay: Secondary | ICD-10-CM | POA: Diagnosis not present

## 2021-01-10 DIAGNOSIS — W19XXXA Unspecified fall, initial encounter: Secondary | ICD-10-CM

## 2021-01-10 DIAGNOSIS — R519 Headache, unspecified: Secondary | ICD-10-CM | POA: Insufficient documentation

## 2021-01-10 DIAGNOSIS — M542 Cervicalgia: Secondary | ICD-10-CM | POA: Diagnosis not present

## 2021-01-10 MED ORDER — METHOCARBAMOL 500 MG PO TABS: 500.0000 mg | ORAL_TABLET | Freq: Two times a day (BID) | ORAL | 0 refills | Status: DC

## 2021-01-10 MED ORDER — LIDOCAINE 5 % EX PTCH: 1.0000 | MEDICATED_PATCH | CUTANEOUS | 0 refills | Status: AC

## 2021-01-10 MED ORDER — OXYCODONE-ACETAMINOPHEN 5-325 MG PO TABS
1.0000 | ORAL_TABLET | Freq: Once | ORAL | Status: AC
  Administered 2021-01-10: 1 via ORAL
  Filled 2021-01-10: qty 1

## 2021-01-10 MED ORDER — OXYCODONE HCL 5 MG PO TABS: 5.0000 mg | ORAL_TABLET | Freq: Four times a day (QID) | ORAL | 0 refills | Status: AC | PRN

## 2021-01-10 MED ORDER — ONDANSETRON 4 MG PO TBDP
4.0000 mg | ORAL_TABLET | Freq: Once | ORAL | Status: AC
  Administered 2021-01-10: 4 mg via ORAL
  Filled 2021-01-10: qty 1
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
## 2021-01-10 NOTE — Discharge Instructions (Addendum)
Follow with orthopedics if your symptoms are unresolved.  If you do not have 1 I have listed on your discharge paperwork.  You will need to call them to schedule an appointment.  Take the medication as prescribed.  Do not drive or operate heavy machinery while taking oxycodone.  This medication may make you sleepy.  Return for new or worsening symptoms

## 2021-01-10 NOTE — ED Notes (Signed)
Pt to CT

## 2021-01-10 NOTE — ED Provider Notes (Signed)
[facility] EMERGENCY DEPARTMENT Provider Note   CSN: [id number] Arrival date & time: 01/10/21  1137     History Chief Complaint  Patient presents with   Marletta Lor    Eric Cowan is a 30 y.o. male with a past medical history who presents for evaluation of fall.  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  Landed on his right shoulder, neck and head.  Denies syncope or LOC.  No anticoagulation.  Patient states since then he has had a mild right-sided headache, midline cervical tenderness and bilateral shoulder pain.  Has been taking OTC medication at home without relief.  No weakness, numbness in arms or legs.  No bowel or bladder incontinence.  No vision changes, emesis, chest pain, shortness of breath, abdominal pain.  Rates his pain a 9/10.  Denies additional aggravating or alleviating factors.   History obtained from patient and past medical records. No interpretor was used.  HPI     Past Medical History:  Diagnosis Date   ADHD    Back pain    Tonsillitis     Patient Active Problem List   Diagnosis Date Noted   Hernia 03/13/2020   Other physical therapy 03/13/2020   Sprain of left ankle 11/01/2019   Tonsillitis 09/22/2019   DDD (degenerative disc disease), lumbosacral 10/12/2013   Low back pain 10/11/2013   Appendicitis 02/15/2013   Hematuria 11/29/2011    Past Surgical History:  Procedure Laterality Date   APPENDECTOMY         Family History  Problem Relation Age of Onset   Healthy Mother    Healthy Father     Social History   Tobacco Use   Smoking status: Never   Smokeless tobacco: Never  Vaping Use   Vaping Use: Never used  Substance Use Topics   Alcohol use: Yes    Comment: occasionally   Drug use: Never    Home Medications Prior to Admission medications   Medication Sig Start Date End Date Taking? Authorizing Provider  lidocaine (LIDODERM) 5 % Place 1 patch onto the skin daily. Remove & Discard patch  within 12 hours or as directed by MD 01/10/21  Yes Krystian Younglove A, PA-C  methocarbamol (ROBAXIN) 500 MG tablet Take 1 tablet (500 mg total) by mouth 2 (two) times daily. 01/10/21  Yes Geet Hosking A, PA-C  oxyCODONE (ROXICODONE) 5 MG immediate release tablet Take 1 tablet (5 mg total) by mouth every 6 (six) hours as needed for up to 3 days for severe pain. 01/10/21 01/13/21 Yes Hayven Croy A, PA-C  Amphetamine-Dextroamphetamine (ADDERALL PO) Take by mouth.    [provider]  atorvastatin (LIPITOR) 10 MG tablet Take by mouth. 02/09/19   [provider]  Cyclobenzaprine HCl (FLEXERIL PO) Take by mouth.    [provider]  HYDROCODONE-ACETAMINOPHEN PO Take by mouth.    [provider]  meloxicam (MOBIC) 15 MG tablet Take 1 tablet (15 mg total) by mouth daily. 03/13/20   Felecia Shelling, DPM  methylPREDNISolone (MEDROL DOSEPAK) 4 MG TBPK tablet 6 day dose pack - take as directed 03/13/20   Felecia Shelling, DPM    Allergies    Patient has no known allergies.  Review of Systems   Review of Systems  Constitutional: Negative.   HENT: Negative.    Respiratory: Negative.    Cardiovascular: Negative.   Gastrointestinal: Negative.   Genitourinary: Negative.   Musculoskeletal:  Positive for back pain  and neck pain. Negative for arthralgias, gait problem, joint swelling, myalgias and neck stiffness.       L shoulder pain  Skin: Negative.   Neurological: Negative.   All other systems reviewed and are negative.  Physical Exam Updated Vital Signs BP (!) 140/95 (BP Location: Right Arm)   Pulse 85   Temp 99.2 F (37.3 C) (Oral)   Resp 20   Ht 6\' 1"  (1.854 m)   Wt 112.5 kg   SpO2 99%   BMI 32.72 kg/m   Physical Exam Vitals and nursing note reviewed.  Constitutional:      General: He is not in acute distress.    Appearance: He is well-developed. He is not ill-appearing, toxic-appearing or diaphoretic.  HENT:     Head: Normocephalic and atraumatic.      Nose: Nose normal.     Mouth/Throat:     Mouth: Mucous membranes are moist.  Eyes:     Pupils: Pupils are equal, round, and reactive to light.  Neck:     Trachea: Trachea and phonation normal.      Comments: C- collar in place. Diffuse tenderness to cervical and paraspinal region. Spasm to Bl trapezius.  No meningismus. Cardiovascular:     Rate and Rhythm: Normal rate and regular rhythm.     Pulses: Normal pulses.          Radial pulses are 2+ on the right side and 2+ on the left side.     Heart sounds: Normal heart sounds.  Pulmonary:     Effort: Pulmonary effort is normal. No respiratory distress.     Breath sounds: Normal breath sounds and air entry.  Chest:     Comments: Nontender bilateral.  Posterior chest wall.  Nontender bilateral clavicles.  Equal rise and fall to chest wall.  No crepitus Abdominal:     General: Bowel sounds are normal. There is no distension.     Palpations: Abdomen is soft.     Tenderness: There is no abdominal tenderness.     Comments: Soft, nontender  Musculoskeletal:        General: Normal range of motion.     Cervical back: Neck supple. Tenderness present. Muscular tenderness present.     Comments: Diffuse tenderness of bilateral trapezius and bilateral shoulders.  Able to lift bilateral shoulders overhead.  Nontender bilateral humerus, forearms, hands.  Skin:    General: Skin is warm and dry.     Capillary Refill: Capillary refill takes less than 2 seconds.     Comments: No rashes or lesions  Neurological:     General: No focal deficit present.     Mental Status: He is alert and oriented to person, place, and time.     Cranial Nerves: Cranial nerves are intact.     Sensory: Sensation is intact.     Motor: Motor function is intact.     Gait: Gait is intact.     Comments: Intact sensation bilaterally Equal handgrip bilaterally Strength equal bilaterally      ED Results / Procedures / Treatments   Labs (all labs ordered are listed, but  only abnormal results are displayed) Labs Reviewed - No data to display  EKG None  Radiology DG Shoulder Right  Result Date: 01/10/2021 CLINICAL DATA:  Pain following fall EXAM: RIGHT SHOULDER - 2+ VIEW COMPARISON:  None. FINDINGS: Oblique and Y scapular images were obtained. No fracture or dislocation. Joint spaces appear normal. No erosive change. Visualized right clear. IMPRESSION: No fracture  or dislocation.  No evident arthropathy. Electronically Signed   By: Bretta Bang III M.D.   On: 01/10/2021 12:46   CT Head Wo Contrast  Result Date: 01/10/2021 CLINICAL DATA:  Pain following fall EXAM: CT HEAD WITHOUT CONTRAST CT CERVICAL SPINE WITHOUT CONTRAST TECHNIQUE: Multidetector CT imaging of the head and cervical spine was performed following the standard protocol without intravenous contrast. Multiplanar CT image reconstructions of the cervical spine were also generated. COMPARISON:  None. FINDINGS: CT HEAD FINDINGS Brain: Ventricles and sulci are normal in size and configuration. There is no intracranial mass, hemorrhage extra-axial fluid collection, or midline shift. The brain parenchyma appears unremarkable. No evident acute infarct. Vascular: No hyperdense vessel.  No evident vascular calcification. Skull: The bony calvarium appears intact. Sinuses/Orbits: There is a tiny retention cyst in the right maxillary antrum inferiorly. Other paranasal sinuses are clear. Frontal sinuses are essentially aplastic. Orbits appear symmetric bilaterally. Other: Mastoid air cells are clear. CT CERVICAL SPINE FINDINGS Alignment: There is no appreciable spondylolisthesis. Skull base and vertebrae: Skull base and craniocervical junction regions appear normal. No evident fracture. No blastic or lytic bone lesions. Soft tissues and spinal canal: Prevertebral soft tissues and predental space regions are normal. No cord canal hematoma. No paraspinous lesions. Disc levels: Disc spaces appear normal. No appreciable  facet arthropathy. No nerve root edema or effacement. No disc extrusion or stenosis. Upper chest: Visualized upper lung regions are clear. Other: None IMPRESSION: CT head: Minimal paranasal sinus disease. Study otherwise unremarkable. CT cervical spine: No fracture or spondylolisthesis. No evident arthropathy. No nerve root edema or effacement. No disc extrusion or stenosis. Electronically Signed   By: Bretta Bang III M.D.   On: 01/10/2021 13:07   CT Cervical Spine Wo Contrast  Result Date: 01/10/2021 CLINICAL DATA:  Pain following fall EXAM: CT HEAD WITHOUT CONTRAST CT CERVICAL SPINE WITHOUT CONTRAST TECHNIQUE: Multidetector CT imaging of the head and cervical spine was performed following the standard protocol without intravenous contrast. Multiplanar CT image reconstructions of the cervical spine were also generated. COMPARISON:  None. FINDINGS: CT HEAD FINDINGS Brain: Ventricles and sulci are normal in size and configuration. There is no intracranial mass, hemorrhage extra-axial fluid collection, or midline shift. The brain parenchyma appears unremarkable. No evident acute infarct. Vascular: No hyperdense vessel.  No evident vascular calcification. Skull: The bony calvarium appears intact. Sinuses/Orbits: There is a tiny retention cyst in the right maxillary antrum inferiorly. Other paranasal sinuses are clear. Frontal sinuses are essentially aplastic. Orbits appear symmetric bilaterally. Other: Mastoid air cells are clear. CT CERVICAL SPINE FINDINGS Alignment: There is no appreciable spondylolisthesis. Skull base and vertebrae: Skull base and craniocervical junction regions appear normal. No evident fracture. No blastic or lytic bone lesions. Soft tissues and spinal canal: Prevertebral soft tissues and predental space regions are normal. No cord canal hematoma. No paraspinous lesions. Disc levels: Disc spaces appear normal. No appreciable facet arthropathy. No nerve root edema or effacement. No disc  extrusion or stenosis. Upper chest: Visualized upper lung regions are clear. Other: None IMPRESSION: CT head: Minimal paranasal sinus disease. Study otherwise unremarkable. CT cervical spine: No fracture or spondylolisthesis. No evident arthropathy. No nerve root edema or effacement. No disc extrusion or stenosis. Electronically Signed   By: Bretta Bang III M.D.   On: 01/10/2021 13:07   CT Thoracic Spine Wo Contrast  Result Date: 01/10/2021 CLINICAL DATA:  Thoracic spine pain after a 4 foot fall off a loading dock today. Initial encounter. EXAM: CT THORACIC SPINE WITHOUT  CONTRAST TECHNIQUE: Multidetector CT images of the thoracic were obtained using the standard protocol without intravenous contrast. COMPARISON:  None. FINDINGS: Alignment: No listhesis. Vertebrae: No fracture or focal pathologic process. Paraspinal and other soft tissues: Negative. Disc levels: Intervertebral disc space height is maintained. IMPRESSION: Negative thoracic spine CT. Electronically Signed   By: Drusilla Kanner M.D.   On: 01/10/2021 12:56   DG Shoulder Left  Result Date: 01/10/2021 CLINICAL DATA:  Pain following fall EXAM: LEFT SHOULDER - 2+ VIEW COMPARISON:  None. FINDINGS: Oblique and Y scapular images were obtained. No fracture or dislocation. Joint spaces appear normal. No erosive change. Visualized left lung clear. IMPRESSION: No fracture or dislocation.  No evident arthropathy. Electronically Signed   By: Bretta Bang III M.D.   On: 01/10/2021 12:53    Procedures Procedures   Medications Ordered in ED Medications  ondansetron (ZOFRAN-ODT) disintegrating tablet 4 mg (4 mg Oral Given 01/10/21 1217)  oxyCODONE-acetaminophen (PERCOCET/ROXICET) 5-325 MG per tablet 1 tablet (1 tablet Oral Given 01/10/21 1217)    ED Course  I have reviewed the triage vital signs and the nursing notes.  Pertinent labs & imaging results that were available during my care of the patient were reviewed by me and considered in  my medical decision making (see chart for details).   30 year old here for evaluation of mechanical fall off of a forefoot loading dock which occurred at work yesterday.  He has had diffuse neck pain, bilateral shoulder pain, proximal thoracic back pain.  Placed in c-collar by EMS.  He is neurovascularly intact.  No obvious traumatic injuries on exam.  No chest wall crepitus or step-off, or evidence of acute abd injury.  Denies LOC or anticoagulation.  Plan on imaging and reassess  Imaging personally reviewed and interpreted:  Imaging without acute findings.  Patient reassessed. C-collar removed. NV intact.  Discussed reassuring imaging.  DC home with Ortho follow-up, RICE for symptomatic management. He will return for new or worsening symptoms  The patient has been appropriately medically screened and/or stabilized in the ED. I have low suspicion for any other emergent medical condition which would require further screening, evaluation or treatment in the ED or require inpatient management.  Patient is hemodynamically stable and in no acute distress.  Patient able to ambulate in department prior to ED.  Evaluation does not show acute pathology that would require ongoing or additional emergent interventions while in the emergency department or further inpatient treatment.  I have discussed the diagnosis with the patient and answered all questions.  Pain is been managed while in the emergency department and patient has no further complaints prior to discharge.  Patient is comfortable with plan discussed in room and is stable for discharge at this time.  I have discussed strict return precautions for returning to the emergency department.  Patient was encouraged to follow-up with PCP/specialist refer to at discharge.     MDM Rules/Calculators/A&P                           Final Clinical Impression(s) / ED Diagnoses Final diagnoses:  Fall, initial encounter  Acute pain of both shoulders  Neck  pain    Rx / DC Orders ED Discharge Orders          Ordered    oxyCODONE (ROXICODONE) 5 MG immediate release tablet  Every 6 hours PRN        01/10/21 1332    methocarbamol (ROBAXIN) 500 MG  tablet  2 times daily        01/10/21 1332    lidocaine (LIDODERM) 5 %  Every 24 hours        01/10/21 1332             Cheronda Erck A, PA-C 01/10/21 1335    Terrilee FilesButler, Michael C, MD 01/10/21 2101

## 2021-02-02 ENCOUNTER — Other Ambulatory Visit (HOSPITAL_COMMUNITY): Payer: Self-pay

## 2021-09-07 ENCOUNTER — Other Ambulatory Visit: Payer: Self-pay

## 2021-09-07 ENCOUNTER — Emergency Department (HOSPITAL_COMMUNITY)
Admission: EM | Admit: 2021-09-07 | Discharge: 2021-09-07 | Disposition: A | Discharge status: Home / Self Care | Payer: No Typology Code available for payment source | Attending: Emergency Medicine | Admitting: Emergency Medicine

## 2021-09-07 DIAGNOSIS — R202 Paresthesia of skin: Secondary | ICD-10-CM | POA: Diagnosis not present

## 2021-09-07 DIAGNOSIS — T543X1A Toxic effect of corrosive alkalis and alkali-like substances, accidental (unintentional), initial encounter: Secondary | ICD-10-CM | POA: Insufficient documentation

## 2021-09-07 DIAGNOSIS — Z77098 Contact with and (suspected) exposure to other hazardous, chiefly nonmedicinal, chemicals: Secondary | ICD-10-CM

## 2021-09-07 NOTE — Discharge Instructions (Signed)
Recheck with your Worker's Comp. provider as needed.  Return to emergency room for severe concerning symptoms.

## 2021-09-07 NOTE — ED Triage Notes (Addendum)
Pt reported to ED for evaluation of chemical exposure after recommendation by employer.  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 P Denies any pain at this time.

## 2021-09-07 NOTE — ED Notes (Addendum)
Pt reported to ED for evaluation of chemical exposure after recommendation by employer.  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 Denies any pain at this time.

## 2021-09-07 NOTE — ED Provider Triage Note (Signed)
Emergency Medicine Provider Triage Evaluation Note  Nicola Quesnell , a 31 y.o. male  was evaluated in triage.  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 Contact with his hands, not wearing gloves. Irrigated hands numerous times at work prior to arrival. Reports tingling in his hands. No other exposure concerns.   Review of Systems  Positive: Hand tingling Negative: SHOB, CP  Physical Exam  BP (!) 152/101    Pulse (!) 104    Temp 99 F (37.2 C) (Oral)    Resp 16    SpO2 97%  Gen:   Awake, no distress   Resp:  Normal effort  MSK:   Moves extremities without difficulty  Other:  Skin exam unremarkable.   Medical Decision Making  Medically screening exam initiated at 9:53 PM.  Appropriate orders placed.  Leeanne Mannan was informed that the remainder of the evaluation will be completed by another provider, this initial triage assessment does not replace that evaluation, and the importance of remaining in the ED until their evaluation is complete.     Jeannie Fend, PA-C 09/07/21 2204

## 2021-09-07 NOTE — ED Provider Notes (Signed)
[facility] EMERGENCY DEPARTMENT Provider Note   CSN: [id number] Arrival date & time: 09/07/21  2115     History  Chief Complaint  Patient presents with   Chemical Exposure    Eric Cowan is a 31 y.o. male.  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 Contact with his hands, not wearing gloves. Irrigated hands numerous times at work prior to arrival. Reports tingling in his hands. No other exposure concerns.       Home Medications Prior to Admission medications   Medication Sig Start Date End Date Taking? Authorizing Provider  Amphetamine-Dextroamphetamine (ADDERALL PO) Take by mouth.    [provider]  atorvastatin (LIPITOR) 10 MG tablet Take by mouth. 02/09/19   [provider]  Cyclobenzaprine HCl (FLEXERIL PO) Take by mouth.    [provider]  HYDROCODONE-ACETAMINOPHEN PO Take by mouth.    [provider]  lidocaine (LIDODERM) 5 % Place 1 patch onto the skin daily. Remove & Discard patch within 12 hours or as directed by MD 01/10/21   Henderly, Britni A, PA-C  meloxicam (MOBIC) 15 MG tablet Take 1 tablet (15 mg total) by mouth daily. 03/13/20   Felecia Shelling, DPM  methocarbamol (ROBAXIN) 500 MG tablet Take 1 tablet (500 mg total) by mouth 2 (two) times daily. 01/10/21   Henderly, Britni A, PA-C  methylPREDNISolone (MEDROL DOSEPAK) 4 MG TBPK tablet 6 day dose pack - take as directed 03/13/20   Felecia Shelling, DPM      Allergies    Patient has no known allergies.    Review of Systems   Review of Systems Negative except as per HPI Physical Exam Updated Vital Signs BP (!) 152/101    Pulse (!) 104    Temp 99 F (37.2 C) (Oral)    Resp 16    SpO2 97%  Physical Exam Vitals and nursing note reviewed.  Constitutional:      General: He is not in acute distress.    Appearance: He is well-developed. He is not diaphoretic.   HENT:     Head: Normocephalic and atraumatic.  Cardiovascular:     Pulses: Normal pulses.  Pulmonary:     Effort: Pulmonary effort is normal.  Musculoskeletal:        General: No swelling, tenderness, deformity or signs of injury.  Skin:    General: Skin is warm and dry.     Findings: No bruising, erythema or rash.  Neurological:     Mental Status: He is alert and oriented to person, place, and time.     Sensory: No sensory deficit.     Motor: No weakness.  Psychiatric:        Behavior: Behavior normal.    ED Results / Procedures / Treatments   Labs (all labs ordered are listed, but only abnormal results are displayed) Labs Reviewed - No data to display  EKG None  Radiology No results found.  Procedures Procedures    Medications Ordered in ED Medications - No data to display  ED Course/ Medical Decision Making/ A&P                           Medical Decision Making 31 year old male with concern for chemical exposure to the hands.  Skin exam is unremarkable and reassuring.  Call to poison control who states if there is  no physical burns at this point, patient has already treated appropriately prior to arrival by washing with soap and water and may be discharged.          Final Clinical Impression(s) / ED Diagnoses Final diagnoses:  Chemical exposure    Rx / DC Orders ED Discharge Orders     None         Jeannie Fend, PA-C 09/07/21 2206    Derwood Kaplan, MD 09/08/21 1520

## 2022-02-26 ENCOUNTER — Emergency Department (HOSPITAL_BASED_OUTPATIENT_CLINIC_OR_DEPARTMENT_OTHER)
Admission: EM | Admit: 2022-02-26 | Discharge: 2022-02-26 | Disposition: A | Discharge status: Home / Self Care | Payer: Managed Care, Other (non HMO) | Attending: Emergency Medicine | Admitting: Emergency Medicine

## 2022-02-26 ENCOUNTER — Encounter (HOSPITAL_BASED_OUTPATIENT_CLINIC_OR_DEPARTMENT_OTHER): Payer: Self-pay | Admitting: Emergency Medicine

## 2022-02-26 ENCOUNTER — Other Ambulatory Visit: Payer: Self-pay

## 2022-02-26 ENCOUNTER — Ambulatory Visit
Admission: RE | Admit: 2022-02-26 | Discharge: 2022-02-26 | Disposition: A | Discharge status: Other Acute Inpatient Hospital | Payer: Managed Care, Other (non HMO) | Source: Ambulatory Visit | Attending: Physician Assistant | Admitting: Physician Assistant

## 2022-02-26 ENCOUNTER — Emergency Department (HOSPITAL_BASED_OUTPATIENT_CLINIC_OR_DEPARTMENT_OTHER): Payer: Managed Care, Other (non HMO)

## 2022-02-26 VITALS — BP 139/95 | HR 89 | Temp 98.2°F | Resp 20

## 2022-02-26 DIAGNOSIS — K529 Noninfective gastroenteritis and colitis, unspecified: Secondary | ICD-10-CM | POA: Diagnosis not present

## 2022-02-26 DIAGNOSIS — R112 Nausea with vomiting, unspecified: Secondary | ICD-10-CM | POA: Diagnosis present

## 2022-02-26 LAB — COMPREHENSIVE METABOLIC PANEL
ALT: 31 U/L (ref 0–44)
AST: 20 U/L (ref 15–41)
Albumin: 4 g/dL (ref 3.5–5.0)
Alkaline Phosphatase: 49 U/L (ref 38–126)
Anion gap: 7 (ref 5–15)
BUN: 10 mg/dL (ref 6–20)
CO2: 24 mmol/L (ref 22–32)
Calcium: 9 mg/dL (ref 8.9–10.3)
Chloride: 109 mmol/L (ref 98–111)
Creatinine, Ser: 0.79 mg/dL (ref 0.61–1.24)
GFR, Estimated: >60 mL/min (ref >60)
Glucose, Bld: 96 mg/dL (ref 70–99)
Potassium: 3.4 mmol/L — ABNORMAL LOW (ref 3.5–5.1)
Sodium: 140 mmol/L (ref 135–145)
Total Bilirubin: 1.3 mg/dL — ABNORMAL HIGH (ref 0.3–1.2)
Total Protein: 7.3 g/dL (ref 6.5–8.1)

## 2022-02-26 LAB — CBC
HCT: 43.1 % (ref 39.0–52.0)
Hemoglobin: 14.8 g/dL (ref 13.0–17.0)
MCH: 30.6 pg (ref 26.0–34.0)
MCHC: 34.3 g/dL (ref 30.0–36.0)
MCV: 89 fL (ref 80.0–100.0)
Platelets: 190 10*3/uL (ref 150–400)
RBC: 4.84 MIL/uL (ref 4.22–5.81)
RDW: 11.9 % (ref 11.5–15.5)
WBC: 11.5 10*3/uL — ABNORMAL HIGH (ref 4.0–10.5)
nRBC: 0 % (ref 0.0–0.2)

## 2022-02-26 LAB — URINALYSIS, ROUTINE W REFLEX MICROSCOPIC
Bilirubin Urine: NEGATIVE
Glucose, UA: NEGATIVE mg/dL
Hgb urine dipstick: NEGATIVE
Ketones, ur: 15 mg/dL — AB
Leukocytes,Ua: NEGATIVE
Nitrite: NEGATIVE
Protein, ur: NEGATIVE mg/dL
Specific Gravity, Urine: 1.015 (ref 1.005–1.030)
pH: 8.5 — ABNORMAL HIGH (ref 5.0–8.0)

## 2022-02-26 LAB — LIPASE, BLOOD: Lipase: 25 U/L (ref 11–51)

## 2022-02-26 MED ORDER — SODIUM CHLORIDE 0.9 % IV BOLUS
1000.0000 mL | Freq: Once | INTRAVENOUS | Status: AC
  Administered 2022-02-26: 1000 mL via INTRAVENOUS

## 2022-02-26 MED ORDER — ONDANSETRON 4 MG PO TBDP
4.0000 mg | ORAL_TABLET | Freq: Once | ORAL | Status: AC
  Administered 2022-02-26: 4 mg via ORAL

## 2022-02-26 MED ORDER — LACTATED RINGERS IV BOLUS
1000.0000 mL | Freq: Once | INTRAVENOUS | Status: AC
  Administered 2022-02-26: 1000 mL via INTRAVENOUS

## 2022-02-26 MED ORDER — METOCLOPRAMIDE HCL 10 MG PO TABS: 10.0000 mg | ORAL_TABLET | Freq: Four times a day (QID) | ORAL | 0 refills | Status: AC

## 2022-02-26 MED ORDER — ONDANSETRON HCL 4 MG/2ML IJ SOLN
4.0000 mg | Freq: Once | INTRAMUSCULAR | Status: AC
  Administered 2022-02-26: 4 mg via INTRAVENOUS

## 2022-02-26 MED ORDER — IOHEXOL 300 MG/ML  SOLN
100.0000 mL | Freq: Once | INTRAMUSCULAR | Status: AC | PRN
  Administered 2022-02-26: 100 mL via INTRAVENOUS

## 2022-02-26 MED ORDER — METOCLOPRAMIDE HCL 5 MG/ML IJ SOLN
10.0000 mg | Freq: Once | INTRAMUSCULAR | Status: AC
  Administered 2022-02-26: 10 mg via INTRAVENOUS
  Filled 2022-02-26: qty 2

## 2022-02-26 NOTE — ED Triage Notes (Signed)
Pt arrives pov, referral from UC, c/o LUQ pain today, also reports HA, n/v x 3 days

## 2022-02-26 NOTE — ED Notes (Signed)
Patient is being discharged from the Urgent Care and sent to the Emergency Department via POV . Per RM, patient is in need of higher level of care due to vomiting. Patient is aware and verbalizes understanding of plan of care.  Vitals:   02/26/22 1238  BP: (!) 139/95  Pulse: 89  Resp: 20  Temp: 98.2 F (36.8 C)  SpO2: 98%

## 2022-02-26 NOTE — ED Triage Notes (Signed)
Pt sts HA with N/V x 3 days; pt sts hx of similar and HA is frontal area

## 2022-02-26 NOTE — ED Notes (Signed)
Pt unable to void at this time. Given specimen cup for when he feels able to try.

## 2022-02-26 NOTE — ED Notes (Signed)
D/c paperwork reviewed with pt, including prescription. Pt with request for work note to be extended so that he may return to work 02/28/2022 which was accommodated and provided to pt.  No further questions or concerns at time of d/c. Pt ambulatory to ED exit without assistance, NAD, accompanied by s/o.

## 2022-02-26 NOTE — Discharge Instructions (Signed)
Your labs today were all very reassuring aside from some evidence of dehydration.  Fortunately this will likely be improved after the 2 bags of fluid that you got both here and at urgent care.  Since you had good results with the Reglan over the Zofran, I have sent you in a prescription for this that you can use as needed.  You can take both the Reglan and the Zofran as needed to help with your symptoms.  Your CT was also negative.  Your symptoms are most likely related to a gastroenteritis, or a viral stomach bug and should resolve on its own in a few more days.

## 2022-02-26 NOTE — ED Provider Notes (Signed)
[facility] EMERGENCY DEPARTMENT Provider Note   CSN: [id number] Arrival date & time: 02/26/22  1402     History  Chief Complaint  Patient presents with   Abdominal Pain    Eric Cowan is a 31 y.o. male with history of appendicitis status post appendectomy who presents here from urgent care for evaluation of intractable nausea and vomiting as well as left upper quadrant pain.  Patient states nausea and vomiting started 3 to 4 days ago and is not responsive to oral Zofran.  Eric Cowan has been unable to tolerate p.o. intake or his medications.  Eric Cowan has been vomiting his Zofran about 30 minutes after taking it.  Denies diarrhea, blood in vomit or stool.  Left upper quadrant pain started earlier today.  Eric Cowan is having normal bowel movements although has not had a bowel movement today.  Patient went to urgent care initially and was given fluids and IV Zofran which did have some improvement before his vomiting returned.  Failed p.o. intake so Eric Cowan was referred to the ED for evaluation and management.  Patient denies fevers, cough, congestion, chest pain, shortness of breath, dysuria, hematuria and headache.   Abdominal Pain Associated symptoms: nausea and vomiting   Associated symptoms: no chest pain, no diarrhea, no dysuria, no fever, no hematuria and no shortness of breath        Home Medications Prior to Admission medications   Medication Sig Start Date End Date Taking? Authorizing Provider  metoCLOPramide (REGLAN) 10 MG tablet Take 1 tablet (10 mg total) by mouth every 6 (six) hours. 02/26/22  Yes Raynald Blend R, PA-C  Amphetamine-Dextroamphetamine (ADDERALL PO) Take by mouth.    [provider]  atorvastatin (LIPITOR) 10 MG tablet Take by mouth. 02/09/19   [provider]  Cyclobenzaprine HCl (FLEXERIL PO) Take by mouth.    [provider]  HYDROCODONE-ACETAMINOPHEN PO Take by mouth.    [provider]  lidocaine (LIDODERM) 5 % Place 1 patch  onto the skin daily. Remove & Discard patch within 12 hours or as directed by MD 01/10/21   Henderly, Britni A, PA-C  meloxicam (MOBIC) 15 MG tablet Take 1 tablet (15 mg total) by mouth daily. 03/13/20   Felecia Shelling, DPM  methocarbamol (ROBAXIN) 500 MG tablet Take 1 tablet (500 mg total) by mouth 2 (two) times daily. 01/10/21   Henderly, Britni A, PA-C  methylPREDNISolone (MEDROL DOSEPAK) 4 MG TBPK tablet 6 day dose pack - take as directed Patient not taking: Reported on 02/26/2022 03/13/20   Felecia Shelling, DPM      Allergies    Patient has no known allergies.    Review of Systems   Review of Systems  Constitutional:  Negative for fever.  Respiratory:  Negative for shortness of breath.   Cardiovascular:  Negative for chest pain.  Gastrointestinal:  Positive for abdominal pain, nausea and vomiting. Negative for blood in stool and diarrhea.  Genitourinary:  Negative for dysuria and hematuria.  Neurological:  Negative for headaches.    Physical Exam Updated Vital Signs BP 126/85   Pulse 86   Temp 98 F (36.7 C) (Oral)   Resp 15   Ht 6\' 1"  (1.854 m)   Wt 117.9 kg   SpO2 98%   BMI 34.30 kg/m  Physical Exam Vitals and nursing note reviewed.  Constitutional:      General: Eric Cowan is not in acute distress.    Appearance: Eric Cowan is ill-appearing. Eric Cowan is not toxic-appearing.  HENT:  Head: Atraumatic.  Eyes:     Conjunctiva/sclera: Conjunctivae normal.  Cardiovascular:     Rate and Rhythm: Normal rate and regular rhythm.     Pulses: Normal pulses.     Heart sounds: No murmur heard. Pulmonary:     Effort: Pulmonary effort is normal. No respiratory distress.     Breath sounds: Normal breath sounds.  Abdominal:     General: Abdomen is flat. There is no distension.     Palpations: Abdomen is soft.     Tenderness: There is abdominal tenderness in the left upper quadrant. There is no right CVA tenderness, left CVA tenderness or guarding. Negative signs include Murphy's sign and McBurney's  sign.  Musculoskeletal:        General: Normal range of motion.     Cervical back: Normal range of motion.  Skin:    General: Skin is warm and dry.     Capillary Refill: Capillary refill takes less than 2 seconds.  Neurological:     General: No focal deficit present.     Mental Status: Eric Cowan is alert.  Psychiatric:        Mood and Affect: Mood normal.     ED Results / Procedures / Treatments   Labs (all labs ordered are listed, but only abnormal results are displayed) Labs Reviewed  COMPREHENSIVE METABOLIC PANEL - Abnormal; Notable for the following components:      Result Value   Potassium 3.4 (*)    Total Bilirubin 1.3 (*)    All other components within normal limits  CBC - Abnormal; Notable for the following components:   WBC 11.5 (*)    All other components within normal limits  URINALYSIS, ROUTINE W REFLEX MICROSCOPIC - Abnormal; Notable for the following components:   pH 8.5 (*)    Ketones, ur 15 (*)    All other components within normal limits  LIPASE, BLOOD    EKG EKG Interpretation  Date/Time:  Tuesday February 26 2022 14:39:42 EDT Ventricular Rate:  72 PR Interval:  140 QRS Duration: 106 QT Interval:  392 QTC Calculation: 429 R Axis:   61 Text Interpretation: Normal sinus rhythm Normal ECG When compared with ECG of 29-Jul-2019 16:06, PREVIOUS ECG IS PRESENT No significant change was found Confirmed by Ezequiel Essex [phone]) on 02/26/2022 2:50:10 PM  Radiology CT ABDOMEN PELVIS W CONTRAST  Result Date: 02/26/2022 CLINICAL DATA:  Left upper quadrant abdominal pain EXAM: CT ABDOMEN AND PELVIS WITH CONTRAST TECHNIQUE: Multidetector CT imaging of the abdomen and pelvis was performed using the standard protocol following bolus administration of intravenous contrast. RADIATION DOSE REDUCTION: This exam was performed according to the departmental dose-optimization program which includes automated exposure control, adjustment of the mA and/or kV according to patient size  and/or use of iterative reconstruction technique. CONTRAST:  163mL OMNIPAQUE IOHEXOL 300 MG/ML  SOLN COMPARISON:  CT abdomen and pelvis 11/24/2012 FINDINGS: Lower chest: No acute abnormality. Hepatobiliary: No focal liver abnormality is seen. No gallstones, gallbladder wall thickening, or biliary dilatation. Pancreas: Unremarkable. No pancreatic ductal dilatation or surrounding inflammatory changes. Spleen: Normal in size without focal abnormality. Adrenals/Urinary Tract: Adrenal glands are unremarkable. Kidneys are normal, without renal calculi, focal lesion, or hydronephrosis. Bladder is unremarkable. Stomach/Bowel: Stomach is within normal limits. Appendectomy. No evidence of bowel wall thickening, distention, or inflammatory changes. Diverticulosis without diverticulitis. Vascular/Lymphatic: No significant vascular findings are present. No enlarged abdominal or pelvic lymph nodes. Reproductive: Unremarkable. Other: No free intraperitoneal fluid or air. Musculoskeletal: No acute or significant osseous  findings. IMPRESSION: Unremarkable CT abdomen and pelvis. Electronically Signed   By: Placido Sou M.D.   On: 02/26/2022 16:18    Procedures Procedures    Medications Ordered in ED Medications  lactated ringers bolus 1,000 mL (1,000 mLs Intravenous New Bag/Given 02/26/22 1545)  metoCLOPramide (REGLAN) injection 10 mg (10 mg Intravenous Given 02/26/22 1546)  iohexol (OMNIPAQUE) 300 MG/ML solution 100 mL (100 mLs Intravenous Contrast Given 02/26/22 1556)    ED Course/ Medical Decision Making/ A&P                           Medical Decision Making Amount and/or Complexity of Data Reviewed Labs: ordered. Radiology: ordered.  Risk Prescription drug management.   Social determinants of health:  Social History   Socioeconomic History   Marital status: Legally Separated    Spouse name: Not on file   Number of children: Not on file   Years of education: Not on file   Highest education level: Not  on file  Occupational History   Not on file  Tobacco Use   Smoking status: Never   Smokeless tobacco: Never  Vaping Use   Vaping Use: Never used  Substance and Sexual Activity   Alcohol use: Yes    Comment: occasionally   Drug use: Never   Sexual activity: Not on file  Other Topics Concern   Not on file  Social History Narrative   Not on file   Social Determinants of Health   Financial Resource Strain: Not on file  Food Insecurity: Not on file  Transportation Needs: Not on file  Physical Activity: Not on file  Stress: Not on file  Social Connections: Not on file  Intimate Partner Violence: Not on file     Initial impression:  This patient presents to the ED for concern of left upper quadrant pain with intractable nausea and vomiting, this involves an extensive number of treatment options, and is a complaint that carries with it a high risk of complications and morbidity.   Differentials include gastritis, enteritis, SBO, intussusception, cholecystitis, cholelithiasis.   Comorbidities affecting care:  None  Additional history obtained: Wife  Lab Tests  I Ordered, reviewed, and interpreted labs and EKG.  The pertinent results include:  Leukocytosis 11.5 Potassium 3.4  Imaging Studies ordered:  I ordered imaging studies including   CT abdomen pelvis is normal  I independently visualized and interpreted imaging and I agree with the radiologist interpretation.   EKG: Sinus rhythm  Cardiac Monitoring:  The patient was maintained on a cardiac monitor.  I personally viewed and interpreted the cardiac monitored which showed an underlying rhythm of: Sinus rhythm   Medicines ordered and prescription drug management:  I ordered medication including: 1 L LR bolus Reglan 10 mg Reevaluation of the patient after these medicines showed that the patient improved I have reviewed the patients home medicines and have made adjustments as needed   ED  Course/Re-evaluation: Patient is overall ill-appearing although nontoxic.  Vitals are without significant abnormality.  On exam, his abdomen is soft, nondistended with tenderness to the left upper quadrant.  No obvious guarding or peritoneal signs.  Negative CVA tenderness bilaterally.  Eric Cowan was given 1 L of fluids along with Reglan 10 mg IV with Improvement in symptoms.  Labs did show mild leukocytosis of 11.5, however given his focal tenderness I did obtain CT imaging which showed No acute findings. On reevaluation pt states his nausea is much improved  compared to when using Zofran. I PO trialed him with crackers and water which patient was able to tolerate. Will dc home with reglan. Symptoms likely viral gastroenteritis and will resolve in a few days.  Disposition:  After consideration of the diagnostic results, physical exam, history and the patients response to treatment feel that the patent would benefit from discharge.   Viral gastroenteritis: Plan and management as described above. Discharged home in good condition.  Final Clinical Impression(s) / ED Diagnoses Final diagnoses:  Gastroenteritis    Rx / DC Orders ED Discharge Orders          Ordered    metoCLOPramide (REGLAN) 10 MG tablet  Every 6 hours        02/26/22 1650              Janell Quiet, [place] 02/26/22 1654    Vanetta Mulders, MD 02/28/22 2324

## 2022-02-26 NOTE — ED Notes (Signed)
Pt endorses IV zofran at [facility]

## 2022-02-26 NOTE — ED Provider Notes (Signed)
[facility]    CSN: [id number] Arrival date & time: 02/26/22  1214      History   Chief Complaint Chief Complaint  Patient presents with   Nausea    Nauseous for three days. Taking zofran with little to no relief. - Entered by patient   Headache    HPI Eric Cowan is a 31 y.o. male.   Patient here today for evaluation of three day history of nausea and vomiting. He has been taking oral zofran without improvement as he reports he vomits about 30 minutes after taking. He has not been able to hold down fluids. He did have headaches but this has resolved. He has some mild LUQ pain but no other abdominal pain. He has not had diarrhea- last BM was 24 hours ago. He reports when he was younger he would have similar episodes 3-4 times a year. He reports he has not taken his adderall in almost two weeks. He denies any other changes or taking any other medications.   The history is provided by the patient.    Past Medical History:  Diagnosis Date   ADHD    Back pain    Tonsillitis     Patient Active Problem List   Diagnosis Date Noted   Hernia 03/13/2020   Other physical therapy 03/13/2020   Sprain of left ankle 11/01/2019   Tonsillitis 09/22/2019   DDD (degenerative disc disease), lumbosacral 10/12/2013   Low back pain 10/11/2013   Appendicitis 02/15/2013   Hematuria 11/29/2011    Past Surgical History:  Procedure Laterality Date   APPENDECTOMY         Home Medications    Prior to Admission medications   Medication Sig Start Date End Date Taking? Authorizing Provider  Amphetamine-Dextroamphetamine (ADDERALL PO) Take by mouth.    [provider]  atorvastatin (LIPITOR) 10 MG tablet Take by mouth. 02/09/19   [provider]  Cyclobenzaprine HCl (FLEXERIL PO) Take by mouth.    [provider]  HYDROCODONE-ACETAMINOPHEN PO Take by mouth.    [provider]  lidocaine (LIDODERM) 5 % Place 1 patch onto the skin  daily. Remove & Discard patch within 12 hours or as directed by MD 01/10/21   Henderly, Britni A, PA-C  meloxicam (MOBIC) 15 MG tablet Take 1 tablet (15 mg total) by mouth daily. 03/13/20   Felecia Shelling, DPM  methocarbamol (ROBAXIN) 500 MG tablet Take 1 tablet (500 mg total) by mouth 2 (two) times daily. 01/10/21   Henderly, Britni A, PA-C  methylPREDNISolone (MEDROL DOSEPAK) 4 MG TBPK tablet 6 day dose pack - take as directed Patient not taking: Reported on 02/26/2022 03/13/20   Felecia Shelling, DPM    Family History Family History  Problem Relation Age of Onset   Healthy Mother    Healthy Father     Social History Social History   Tobacco Use   Smoking status: Never   Smokeless tobacco: Never  Vaping Use   Vaping Use: Never used  Substance Use Topics   Alcohol use: Yes    Comment: occasionally   Drug use: Never     Allergies   Patient has no known allergies.   Review of Systems Review of Systems  Constitutional:  Negative for chills and fever.  Eyes:  Negative for discharge and redness.  Gastrointestinal:  Positive for abdominal pain, nausea and vomiting. Negative for blood in stool and diarrhea.  Neurological:  Negative for numbness.  Physical Exam Triage Vital Signs ED Triage Vitals  Enc Vitals Group     BP      Pulse      Resp      Temp      Temp src      SpO2      Weight      Height      Head Circumference      Peak Flow      Pain Score      Pain Loc      Pain Edu?      Excl. in GC?    No data found.  Updated Vital Signs BP (!) 139/95 (BP Location: Left Arm)   Pulse 89   Temp 98.2 F (36.8 C) (Oral)   Resp 20   SpO2 98%      Physical Exam Vitals and nursing note reviewed.  Constitutional:      General: He is not in acute distress.    Appearance: Normal appearance. He is ill-appearing (appears to not feel well).  HENT:     Head: Normocephalic and atraumatic.  Eyes:     Conjunctiva/sclera: Conjunctivae normal.  Cardiovascular:      Rate and Rhythm: Normal rate.  Pulmonary:     Effort: Pulmonary effort is normal.  Abdominal:     Comments: Actively vomiting after zofran ODT administered  Neurological:     Mental Status: He is alert.  Psychiatric:        Mood and Affect: Mood normal.        Behavior: Behavior normal.        Thought Content: Thought content normal.      UC Treatments / Results  Labs (all labs ordered are listed, but only abnormal results are displayed) Labs Reviewed - No data to display  EKG   Radiology No results found.  Procedures Procedures (including critical care time)  Medications Ordered in UC Medications  sodium chloride 0.9 % bolus 1,000 mL (0 mLs Intravenous Stopped 02/26/22 1342)  ondansetron (ZOFRAN-ODT) disintegrating tablet 4 mg (4 mg Oral Given 02/26/22 1257)  ondansetron (ZOFRAN) injection 4 mg (4 mg Intravenous Given 02/26/22 1343)    Initial Impression / Assessment and Plan / UC Course  I have reviewed the triage vital signs and the nursing notes.  Pertinent labs & imaging results that were available during my care of the patient were reviewed by me and considered in my medical decision making (see chart for details).    IV fluids administered in office as well as ODT zofran and then IV zofran due to vomiting. Patient unable to tolerate ODT zofran and failed oral intake-- given duration of symptoms recommended further evaluation in the ED for stat labs, etc. Patient is agreeable.   Final Clinical Impressions(s) / UC Diagnoses   Final diagnoses:  Nausea and vomiting, unspecified vomiting type   Discharge Instructions   None    ED Prescriptions   None    PDMP not reviewed this encounter.   Tomi Bamberger, PA-C 02/26/22 1353

## 2022-12-21 IMAGING — CR DG SHOULDER 2+V*R*
2 series · 2 of 2 positions shown · non-contrast
Comparison: None.

CLINICAL DATA: Pain following fall

EXAM:
RIGHT SHOULDER - 2+ VIEW

[shoulder grashey]
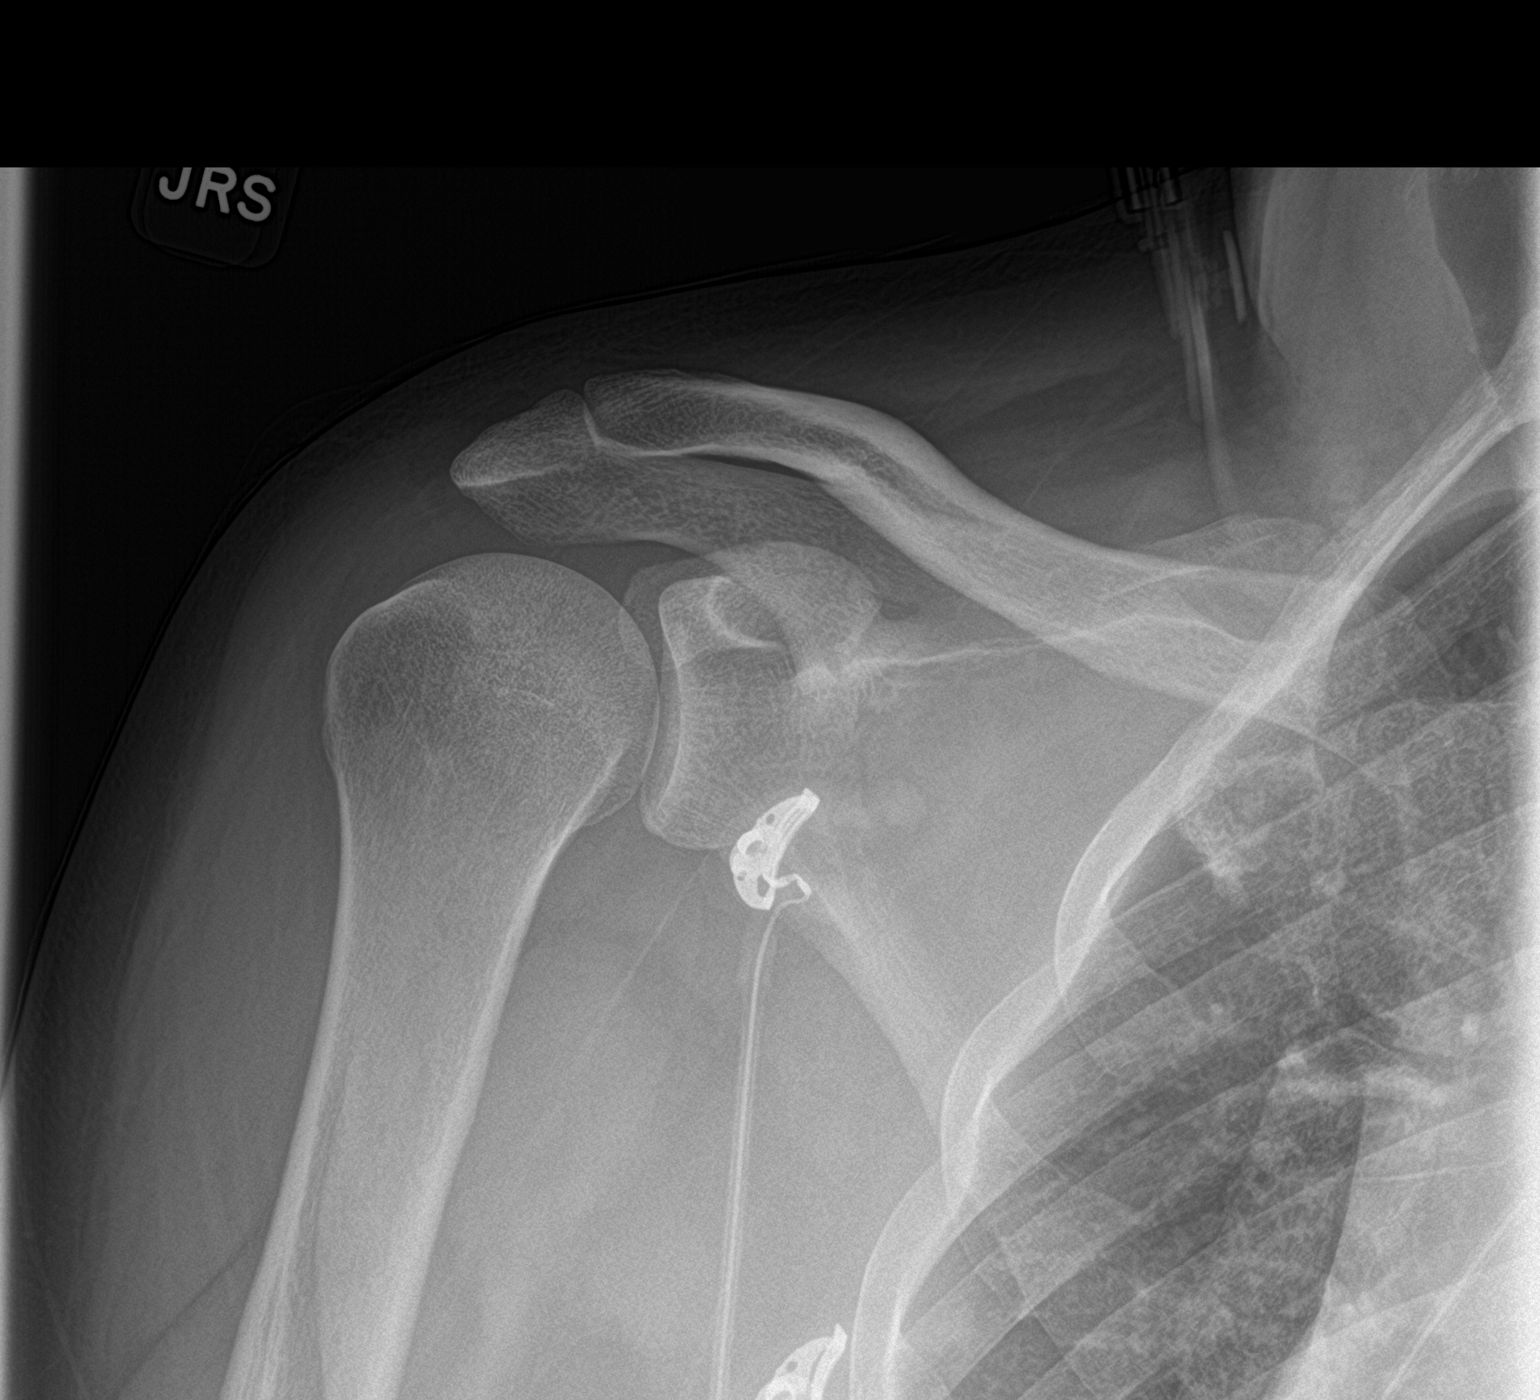

[shoulder y view]
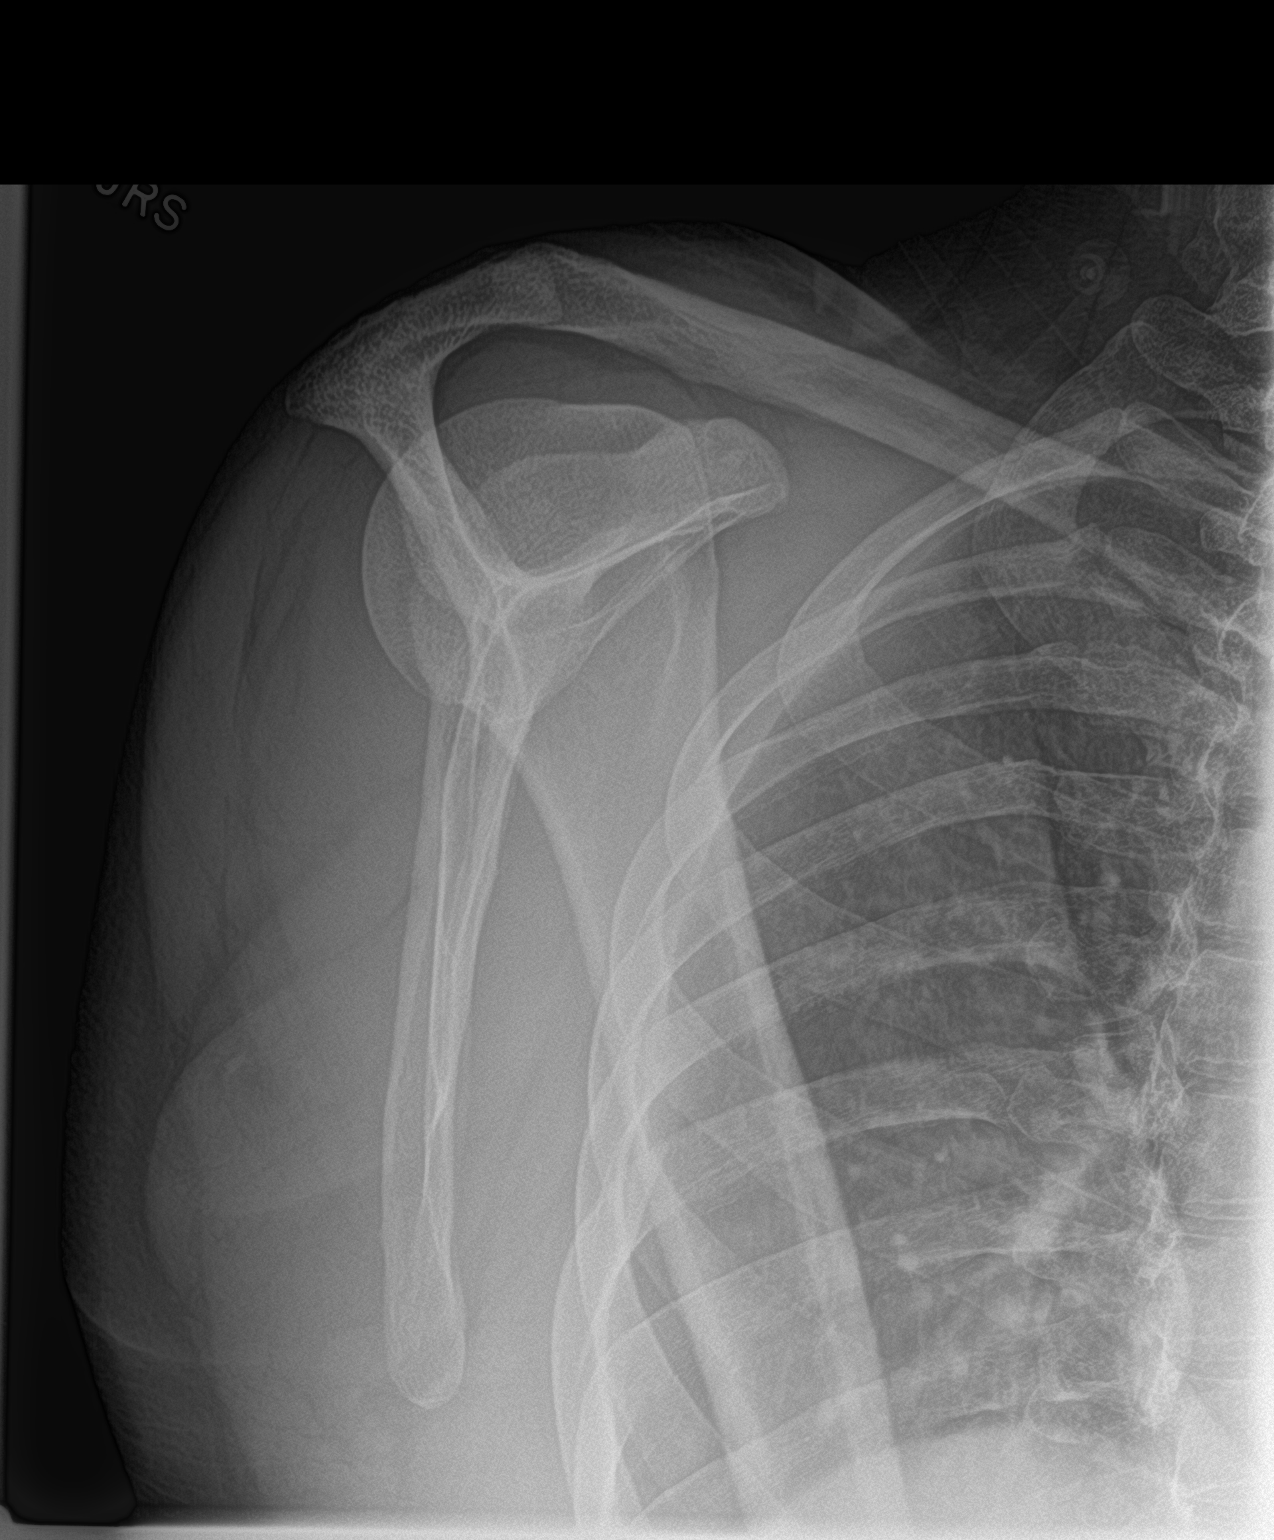

[2 of 2 positions shown; findings below may reference images not displayed]

FINDINGS: Oblique and Y scapular images were obtained. No fracture or
dislocation. Joint spaces appear normal. No erosive change.
Visualized right clear.
IMPRESSION: No fracture or dislocation.  No evident arthropathy.

## 2023-01-23 ENCOUNTER — Emergency Department (HOSPITAL_BASED_OUTPATIENT_CLINIC_OR_DEPARTMENT_OTHER)
Admission: EM | Admit: 2023-01-23 | Discharge: 2023-01-23 | Disposition: A | Discharge status: Home / Self Care | Payer: Managed Care, Other (non HMO) | Attending: Emergency Medicine | Admitting: Emergency Medicine

## 2023-01-23 ENCOUNTER — Emergency Department (HOSPITAL_BASED_OUTPATIENT_CLINIC_OR_DEPARTMENT_OTHER): Payer: Managed Care, Other (non HMO)

## 2023-01-23 ENCOUNTER — Encounter (HOSPITAL_BASED_OUTPATIENT_CLINIC_OR_DEPARTMENT_OTHER): Payer: Self-pay | Admitting: Emergency Medicine

## 2023-01-23 ENCOUNTER — Other Ambulatory Visit: Payer: Self-pay

## 2023-01-23 DIAGNOSIS — R519 Headache, unspecified: Secondary | ICD-10-CM | POA: Insufficient documentation

## 2023-01-23 DIAGNOSIS — R42 Dizziness and giddiness: Secondary | ICD-10-CM | POA: Insufficient documentation

## 2023-01-23 DIAGNOSIS — Z20822 Contact with and (suspected) exposure to covid-19: Secondary | ICD-10-CM | POA: Insufficient documentation

## 2023-01-23 DIAGNOSIS — R5383 Other fatigue: Secondary | ICD-10-CM | POA: Insufficient documentation

## 2023-01-23 HISTORY — DX: Migraine, unspecified, not intractable, without status migrainosus: G43.909

## 2023-01-23 LAB — CBC WITH DIFFERENTIAL/PLATELET
Abs Immature Granulocytes: 0.01 10*3/uL (ref 0.00–0.07)
Basophils Absolute: 0.1 10*3/uL (ref 0.0–0.1)
Basophils Relative: 1 %
Eosinophils Absolute: 0.6 10*3/uL — ABNORMAL HIGH (ref 0.0–0.5)
Eosinophils Relative: 7 %
HCT: 47.8 % (ref 39.0–52.0)
Hemoglobin: 16 g/dL (ref 13.0–17.0)
Immature Granulocytes: 0 %
Lymphocytes Relative: 32 %
Lymphs Abs: 2.5 10*3/uL (ref 0.7–4.0)
MCH: 30 pg (ref 26.0–34.0)
MCHC: 33.5 g/dL (ref 30.0–36.0)
MCV: 89.5 fL (ref 80.0–100.0)
Monocytes Absolute: 0.6 10*3/uL (ref 0.1–1.0)
Monocytes Relative: 8 %
Neutro Abs: 4.1 10*3/uL (ref 1.7–7.7)
Neutrophils Relative %: 52 %
Platelets: 201 10*3/uL (ref 150–400)
RBC: 5.34 MIL/uL (ref 4.22–5.81)
RDW: 11.9 % (ref 11.5–15.5)
WBC: 7.8 10*3/uL (ref 4.0–10.5)
nRBC: 0 % (ref 0.0–0.2)

## 2023-01-23 LAB — BASIC METABOLIC PANEL
Anion gap: 8 (ref 5–15)
BUN: 9 mg/dL (ref 6–20)
CO2: 24 mmol/L (ref 22–32)
Calcium: 9.3 mg/dL (ref 8.9–10.3)
Chloride: 107 mmol/L (ref 98–111)
Creatinine, Ser: 0.72 mg/dL (ref 0.61–1.24)
GFR, Estimated: >60 mL/min (ref >60)
Glucose, Bld: 92 mg/dL (ref 70–99)
Potassium: 4 mmol/L (ref 3.5–5.1)
Sodium: 139 mmol/L (ref 135–145)

## 2023-01-23 LAB — TROPONIN I (HIGH SENSITIVITY): Troponin I (High Sensitivity): <2 ng/L (ref <18)

## 2023-01-23 LAB — D-DIMER, QUANTITATIVE: D-Dimer, Quant: 0.72 ug/mL-FEU — ABNORMAL HIGH (ref 0.00–0.50)

## 2023-01-23 LAB — SARS CORONAVIRUS 2 BY RT PCR: SARS Coronavirus 2 by RT PCR: NEGATIVE

## 2023-01-23 MED ORDER — IOHEXOL 350 MG/ML SOLN
100.0000 mL | Freq: Once | INTRAVENOUS | Status: AC | PRN
  Administered 2023-01-23: 80 mL via INTRAVENOUS

## 2023-01-23 NOTE — ED Provider Notes (Signed)
[place] [facility] AT [facility] Provider Note   CSN: [id number] Arrival date & time: 01/23/23  1121     History  Chief Complaint  Patient presents with   Dizziness    Eric Cowan is a 32 y.o. male with history of frequent migraines presenting to the ED with constellation of symptoms.  Patient reports onset of symptoms Monday, 4 days ago, with episodes of lightheadedness, mild intermittent headaches, and feeling fatigued.  He went to see his PCP yesterday who did blood work, Psychiatrist, which I was able to review in Care Everywhere.  His CMP, CBC was unremarkable.  TSH was also normal.  He had a negative monotest.  The patient reports he began having a headache at the PCP and was given a migraine cocktail and the headache went away.  He denies any headache now.  He reports concerns that he continues to have episodes, both completely at rest and with activity, where he will begin to feel short of breath and have a "rushing lightheadedness sensation" in his head.  He has never had these symptoms before.  He has had some mild dyspnea on exertion this week.  He denies chest pain.  Denies history of DVT or PE.  He denies any personal or family history of brain aneurysm.  He reports he does get headaches several times a month, about 7 migraines in the past month, and he is on a triptan medication for this.  He also was prescribed Adderall which she takes every other day.  He has been off this medication in the past week.  He denies any recreational drug use or illicit drug use  HPI     Home Medications Prior to Admission medications   Medication Sig Start Date End Date Taking? Authorizing Provider  Amphetamine-Dextroamphetamine (ADDERALL PO) Take by mouth.    [provider]  atorvastatin (LIPITOR) 10 MG tablet Take by mouth. 02/09/19   [provider]  Cyclobenzaprine HCl (FLEXERIL PO) Take by mouth.    [provider]   HYDROCODONE-ACETAMINOPHEN PO Take by mouth.    [provider]  lidocaine (LIDODERM) 5 % Place 1 patch onto the skin daily. Remove & Discard patch within 12 hours or as directed by MD 01/10/21   Henderly, Britni A, PA-C  meloxicam (MOBIC) 15 MG tablet Take 1 tablet (15 mg total) by mouth daily. 03/13/20   Felecia Shelling, DPM  methocarbamol (ROBAXIN) 500 MG tablet Take 1 tablet (500 mg total) by mouth 2 (two) times daily. 01/10/21   Henderly, Britni A, PA-C  methylPREDNISolone (MEDROL DOSEPAK) 4 MG TBPK tablet 6 day dose pack - take as directed Patient not taking: Reported on 02/26/2022 03/13/20   Felecia Shelling, DPM  metoCLOPramide (REGLAN) 10 MG tablet Take 1 tablet (10 mg total) by mouth every 6 (six) hours. 02/26/22   Janell Quiet, PA-C      Allergies    Patient has no known allergies.    Review of Systems   Review of Systems  Physical Exam Updated Vital Signs BP (!) 140/93   Pulse 66   Temp 98.7 F (37.1 C) (Oral)   Resp 14   SpO2 100%  Physical Exam Constitutional:      General: He is not in acute distress. HENT:     Head: Normocephalic and atraumatic.  Eyes:     Conjunctiva/sclera: Conjunctivae normal.     Pupils: Pupils are equal, round, and reactive to light.  Cardiovascular:  Rate and Rhythm: Normal rate and regular rhythm.     Pulses: Normal pulses.  Pulmonary:     Effort: Pulmonary effort is normal. No respiratory distress.  Skin:    General: Skin is warm and dry.  Neurological:     General: No focal deficit present.     Mental Status: He is alert and oriented to person, place, and time. Mental status is at baseline.     Cranial Nerves: No cranial nerve deficit.     Sensory: No sensory deficit.     Motor: No weakness.  Psychiatric:        Mood and Affect: Mood normal.        Behavior: Behavior normal.     ED Results / Procedures / Treatments   Labs (all labs ordered are listed, but only abnormal results are displayed) Labs Reviewed  CBC WITH  DIFFERENTIAL/PLATELET - Abnormal; Notable for the following components:      Result Value   Eosinophils Absolute 0.6 (*)    All other components within normal limits  D-DIMER, QUANTITATIVE - Abnormal; Notable for the following components:   D-Dimer, Quant 0.72 (*)    All other components within normal limits  SARS CORONAVIRUS 2 BY RT PCR  BASIC METABOLIC PANEL  TROPONIN I (HIGH SENSITIVITY)    EKG EKG Interpretation Date/Time:  Thursday January 23 2023 11:28:03 EDT Ventricular Rate:  90 PR Interval:  130 QRS Duration:  91 QT Interval:  339 QTC Calculation: 415 R Axis:   61  Text Interpretation: Sinus rhythm ST elev, probable normal early repol pattern Confirmed by Alvester Chou [id number]) on 01/23/2023 11:36:09 AM  Radiology CT Angio Chest PE W and/or Wo Contrast  Result Date: 01/23/2023 CLINICAL DATA:  Dizziness.  Shortness of breath. EXAM: CT ANGIOGRAPHY CHEST WITH CONTRAST TECHNIQUE: Multidetector CT imaging of the chest was performed using the standard protocol during bolus administration of intravenous contrast. Multiplanar CT image reconstructions and MIPs were obtained to evaluate the vascular anatomy. RADIATION DOSE REDUCTION: This exam was performed according to the departmental dose-optimization program which includes automated exposure control, adjustment of the mA and/or kV according to patient size and/or use of iterative reconstruction technique. CONTRAST:  80mL OMNIPAQUE IOHEXOL 350 MG/ML SOLN COMPARISON:  None Available. FINDINGS: Cardiovascular: Satisfactory opacification of the pulmonary arteries to the segmental level. No evidence of pulmonary embolism. Normal heart size. No pericardial effusion. Mediastinum/Nodes: No enlarged mediastinal, hilar, or axillary lymph nodes. Thyroid gland, trachea, and esophagus demonstrate no significant findings. Lungs/Pleura: Lungs are clear. No pleural effusion or pneumothorax. Upper Abdomen: No acute abnormality. Musculoskeletal: No chest  wall abnormality. No acute or significant osseous findings. Review of the MIP images confirms the above findings. IMPRESSION: No definite evidence of pulmonary embolus. No acute abnormality seen in the chest. Electronically Signed   By: Lupita Raider M.D.   On: 01/23/2023 14:35   CT Head Wo Contrast  Result Date: 01/23/2023 CLINICAL DATA:  Dizziness, headaches none EXAM: CT HEAD WITHOUT CONTRAST TECHNIQUE: Contiguous axial images were obtained from the base of the skull through the vertex without intravenous contrast. RADIATION DOSE REDUCTION: This exam was performed according to the departmental dose-optimization program which includes automated exposure control, adjustment of the mA and/or kV according to patient size and/or use of iterative reconstruction technique. COMPARISON:  01/10/2021 FINDINGS: Brain: No evidence of acute infarction, hemorrhage, mass, mass effect, or midline shift. No hydrocephalus or extra-axial fluid collection. Vascular: No hyperdense vessel. Skull: Negative for fracture or focal lesion. Sinuses/Orbits:  No acute finding. Other: The mastoid air cells are well aerated. IMPRESSION: No acute intracranial process. Electronically Signed   By: Wiliam Ke M.D.   On: 01/23/2023 14:13    Procedures Procedures    Medications Ordered in ED Medications  iohexol (OMNIPAQUE) 350 MG/ML injection 100 mL (80 mLs Intravenous Contrast Given 01/23/23 1334)    ED Course/ Medical Decision Making/ A&P Clinical Course as of 01/24/23 0617  Thu Jan 23, 2023  1300 Pt updated about ddimer positive - in agreement with proceeding with CT PE study at this time. [MT]  1445 Patient was reassessed and telemetry reviewed.  He has remained stable in the ED with no arrhythmias noted on his telemetry monitoring.  He continues to have intermittent sensation of lightheadedness, but I have not identified any emergent medical cause for this.  I think it is possible to still a viral syndrome.  I think it is  reasonable to continue with expectant conservative management at home, staying well-hydrated, and I will provide a work note as well.  His girlfriend who is here at the bedside also stays with him and can keep an eye on him.  I would expect improvement of his symptoms over the next several days.  If his symptoms worsen he will need to return to the ER.  They verbalized understanding [MT]    Clinical Course User Index [MT] Tait Balistreri, Kermit Balo, MD                             Medical Decision Making Amount and/or Complexity of Data Reviewed Labs: ordered. Radiology: ordered.  Risk Prescription drug management.   This patient presents to the ED with concern for dyspnea, lightheadedness, episodic symptoms. This involves an extensive number of treatment options, and is a complaint that carries with it a high risk of complications and morbidity.  The differential diagnosis includes viral illness versus acute anemia versus electrolyte derangement versus arrhythmia versus pulmonary embolism versus other  Patient does not have any persistent headache at this time; no nuchal rigidity or fever, doubt meningitis.  Doubt SAH.  No indication for emergent LP at this time  Ddimer is reasonable given dyspnea on exertion, acute onset - for PE screening.  No other acute PE risk factors.  External records from outside source obtained and reviewed including [facility] office test results from yesterday - WBC wnl; hgb wnl.  I ordered and personally interpreted labs.  The pertinent results include:  no emergent findings.  Ddimer positive (nonspecific).  I ordered imaging studies including CT PE and CT head I independently visualized and interpreted imaging which showed no emergent findings I agree with the radiologist interpretation  The patient was maintained on a cardiac monitor.  I personally viewed and interpreted the cardiac monitored which showed an underlying rhythm of: Sinus rhythm  Per my interpretation  the patient's ECG shows sinus rhythm with no acute ischemic findings  I have reviewed the patients home medicines and have made adjustments as needed  After the interventions noted above, I reevaluated the patient and found that they have: stayed the same  Dispostion:  After consideration of the diagnostic results and the patients response to treatment, I feel that the patent would benefit from close outpatient follow up.  At this time I have not identified any emergent medical condition to explain the patient's symptoms.  I have a low suspicion clinically for sepsis, arhythmia, DVT, aortic dissection, CVA, or intracranial  thrombosis, or vertebral artery dissection. I suspect this may be a viral syndrome, and advised continued watchful management at home for the next several days.  If developing new or worsening symptoms, I strongly encouraged patient to return to the ED. Otherwise I recommended PCP office follow up.         Final Clinical Impression(s) / ED Diagnoses Final diagnoses:  Dizziness  Lightheadedness    Rx / DC Orders ED Discharge Orders     None         Amando Chaput, Kermit Balo, MD 01/24/23 [phone]

## 2023-01-23 NOTE — ED Triage Notes (Signed)
Pt having dizziness since Monday.  More frequent headaches than normal for patient.  Pt seen at PCP yesterday, had workup and fluids, sent home with instructions to return to ED if not improving.  Pt woke up today with dizziness and now sob.

## 2023-01-23 NOTE — ED Notes (Signed)
Pt. Reports he has felt dizzy and fuzzy since yesterday and just not 100%.  Pt. Was seen at his PMD office yesterday.  No abnormal labs and had fluids.

## 2023-01-23 NOTE — Discharge Instructions (Addendum)
  Please stay home for the next several days, rest, keep yourself hydrated.  You should not drive a car or operate machinery or perform any dangerous activities if you are feeling lightheaded or woozy.  You may be experiencing a viral syndrome.  This should improve over the next few days.  Your blood test and CT scans in the ER were reassuring today, and did not reveal any emergency cause for your symptoms. Specifically we did not see signs of anemia, bacterial infection, brain injury, pulmonary embolism (blood clot in the lungs), or pneumonia.  If you have worsening lightheadedness, severe headache, persistent vomiting, strokelike symptoms, difficulty breathing, or any other emergency medical concerns, please return to the ER.

## 2023-08-01 ENCOUNTER — Emergency Department (HOSPITAL_BASED_OUTPATIENT_CLINIC_OR_DEPARTMENT_OTHER)
Admission: EM | Admit: 2023-08-01 | Discharge: 2023-08-01 | Disposition: A | Discharge status: Home / Self Care | Payer: Managed Care, Other (non HMO) | Attending: Emergency Medicine | Admitting: Emergency Medicine

## 2023-08-01 ENCOUNTER — Encounter (HOSPITAL_BASED_OUTPATIENT_CLINIC_OR_DEPARTMENT_OTHER): Payer: Self-pay | Admitting: *Deleted

## 2023-08-01 ENCOUNTER — Other Ambulatory Visit: Payer: Self-pay

## 2023-08-01 DIAGNOSIS — M545 Low back pain, unspecified: Secondary | ICD-10-CM | POA: Diagnosis present

## 2023-08-01 DIAGNOSIS — G8929 Other chronic pain: Secondary | ICD-10-CM | POA: Diagnosis not present

## 2023-08-01 MED ORDER — KETOROLAC TROMETHAMINE 60 MG/2ML IM SOLN
30.0000 mg | Freq: Once | INTRAMUSCULAR | Status: AC
  Administered 2023-08-01: 30 mg via INTRAMUSCULAR
  Filled 2023-08-01: qty 2

## 2023-08-01 MED ORDER — METHOCARBAMOL 500 MG PO TABS: 500.0000 mg | ORAL_TABLET | Freq: Three times a day (TID) | ORAL | 0 refills | Status: DC | PRN

## 2023-08-01 MED ORDER — DEXAMETHASONE SODIUM PHOSPHATE 10 MG/ML IJ SOLN
10.0000 mg | Freq: Once | INTRAMUSCULAR | Status: AC
  Administered 2023-08-01: 10 mg via INTRAMUSCULAR
  Filled 2023-08-01: qty 1

## 2023-08-01 MED ORDER — DEXAMETHASONE 4 MG PO TABS: ORAL_TABLET | ORAL | 0 refills | Status: AC

## 2023-08-01 MED ORDER — ACETAMINOPHEN 500 MG PO TABS
1000.0000 mg | ORAL_TABLET | Freq: Once | ORAL | Status: AC
  Administered 2023-08-01: 1000 mg via ORAL
  Filled 2023-08-01: qty 2

## 2023-08-01 NOTE — ED Notes (Signed)
 ED Provider at bedside.

## 2023-08-01 NOTE — ED Provider Notes (Signed)
 [place] [facility] AT [facility] Provider Note  CSN: [id number] Arrival date & time: 08/01/23 9740  Chief Complaint(s) Back Pain  HPI Eric Cowan is a 33 y.o. male with a past medical history listed below including chronic back pain who presents to the emergency department for exacerbation of his back pain for the past several days worsening since onset.  Patient reports intermittent bilateral radicular pain.  Pain in the left is now more persistent.  He denies any lower extremity weakness or loss of sensation.  No bladder/bowel incontinence.  No fall or trauma.  No fevers or chills.  No other physical complaints.   Back Pain   Past Medical History Past Medical History:  Diagnosis Date   ADHD    Back pain    Migraines    Tonsillitis    Patient Active Problem List   Diagnosis Date Noted   Hernia 03/13/2020   Other physical therapy 03/13/2020   Sprain of left ankle 11/01/2019   Tonsillitis 09/22/2019   DDD (degenerative disc disease), lumbosacral 10/12/2013   Low back pain 10/11/2013   Appendicitis 02/15/2013   Hematuria 11/29/2011   Home Medication(s) Prior to Admission medications   Medication Sig Start Date End Date Taking? Authorizing Provider  dexamethasone  (DECADRON ) 4 MG tablet Take one table every other day as needed for pain not controlled with other medications 08/01/23  Yes Joycie Aerts, Raynell Moder, MD  methocarbamol  (ROBAXIN ) 500 MG tablet Take 1-2 tablets (500-1,000 mg total) by mouth every 8 (eight) hours as needed for muscle spasms. 08/01/23  Yes Sadhana Frater, Raynell Moder, MD  Amphetamine-Dextroamphetamine (ADDERALL PO) Take by mouth.    [provider]  atorvastatin (LIPITOR) 10 MG tablet Take by mouth. 02/09/19   [provider]  HYDROCODONE -ACETAMINOPHEN  PO Take by mouth.    [provider]  lidocaine  (LIDODERM ) 5 % Place 1 patch onto the skin daily. Remove & Discard patch within 12 hours or as directed by MD  01/10/21   Henderly, Britni A, PA-C  meloxicam  (MOBIC ) 15 MG tablet Take 1 tablet (15 mg total) by mouth daily. 03/13/20   Janit Thresa HERO, DPM  metoCLOPramide  (REGLAN ) 10 MG tablet Take 1 tablet (10 mg total) by mouth every 6 (six) hours. 02/26/22   Conklin, Erica R, PA-C                                                                                                                                    Allergies Patient has no known allergies.  Review of Systems Review of Systems  Musculoskeletal:  Positive for back pain.   As noted in HPI  Physical Exam Vital Signs  I have reviewed the triage vital signs BP (!) 146/90 (BP Location: Right Arm)   Pulse 97   Temp 98 F (36.7 C)   Resp 16   SpO2 100%   Physical Exam Vitals reviewed.  Constitutional:  General: He is not in acute distress.    Appearance: He is well-developed. He is not diaphoretic.  HENT:     Head: Normocephalic and atraumatic.     Right Ear: External ear normal.     Left Ear: External ear normal.     Nose: Nose normal.     Mouth/Throat:     Mouth: Mucous membranes are moist.  Eyes:     General: No scleral icterus.    Conjunctiva/sclera: Conjunctivae normal.  Neck:     Trachea: Phonation normal.  Cardiovascular:     Rate and Rhythm: Normal rate and regular rhythm.  Pulmonary:     Effort: Pulmonary effort is normal. No respiratory distress.     Breath sounds: No stridor.  Abdominal:     General: There is no distension.  Musculoskeletal:        General: Normal range of motion.     Cervical back: Normal range of motion.     Lumbar back: Tenderness present.     Comments: Spine Exam: Strength: 5/5 throughout LE bilaterally  Sensation: Intact to light touch in proximal and distal LE bilaterally  Neurological:     Mental Status: He is alert and oriented to person, place, and time.  Psychiatric:        Behavior: Behavior normal.     ED Results and Treatments Labs (all labs ordered are listed, but only  abnormal results are displayed) Labs Reviewed - No data to display                                                                                                                       EKG  EKG Interpretation Date/Time:    Ventricular Rate:    PR Interval:    QRS Duration:    QT Interval:    QTC Calculation:   R Axis:      Text Interpretation:         Radiology No results found.  Medications Ordered in ED Medications  ketorolac  (TORADOL ) injection 30 mg (30 mg Intramuscular Given 08/01/23 0350)  acetaminophen  (TYLENOL ) tablet 1,000 mg (1,000 mg Oral Given 08/01/23 0349)  dexamethasone  (DECADRON ) injection 10 mg (10 mg Intramuscular Given 08/01/23 0350)   Procedures Procedures  (including critical care time) Medical Decision Making / ED Course   Medical Decision Making Risk OTC drugs. Prescription drug management.    33 y.o. male presents with back pain in lumbar area. Exacerbation of chronic back pain with left sided radiculopathy. No acute traumatic onset. No red flag symptoms of fever, weight loss, saddle anesthesia, weakness, fecal/urinary incontinence or urinary retention.   Suspect MSK etiology vs disc herniation. No indication for imaging emergently.   Improved pain with IM toradol  and decadron  and oral Tylenol . Return precautions discussed for worsening or new concerning symptoms.      Final Clinical Impression(s) / ED Diagnoses Final diagnoses:  Chronic radicular pain of lower back   The patient appears reasonably screened and/or stabilized for discharge and  I doubt any other medical condition or other [facility] requiring further screening, evaluation, or treatment in the ED at this time. I have discussed the findings, Dx and Tx plan with the patient/family who expressed understanding and agree(s) with the plan. Discharge instructions discussed at length. The patient/family was given strict return precautions who verbalized understanding of the instructions. No further  questions at time of discharge.  Disposition: Discharge  Condition: Good  ED Discharge Orders          Ordered    methocarbamol  (ROBAXIN ) 500 MG tablet  Every 8 hours PRN        08/01/23 0450    dexamethasone  (DECADRON ) 4 MG tablet        08/01/23 0450             Follow Up: Suanne Pfeiffer, NP [address] [place] [address] [phone]  Call  to schedule an appointment for close follow up    This chart was dictated using voice recognition software.  Despite best efforts to proofread,  errors can occur which can change the documentation meaning.    Trine Raynell Moder, MD 08/01/23 [phone]

## 2023-08-01 NOTE — ED Triage Notes (Signed)
 Pt had PT for back on Monday and has had worsening back pain since then with radiation to bilateral legs. Denies urinary/bowel incontinence

## 2023-09-12 ENCOUNTER — Other Ambulatory Visit (HOSPITAL_BASED_OUTPATIENT_CLINIC_OR_DEPARTMENT_OTHER): Payer: Self-pay | Admitting: Physician Assistant

## 2023-09-12 DIAGNOSIS — M79672 Pain in left foot: Secondary | ICD-10-CM

## 2023-09-13 ENCOUNTER — Other Ambulatory Visit: Payer: Self-pay

## 2023-09-13 ENCOUNTER — Ambulatory Visit (HOSPITAL_BASED_OUTPATIENT_CLINIC_OR_DEPARTMENT_OTHER)
Admission: RE | Admit: 2023-09-13 | Discharge: 2023-09-13 | Disposition: A | Discharge status: Home / Self Care | Payer: Managed Care, Other (non HMO) | Source: Ambulatory Visit | Attending: Emergency Medicine | Admitting: Emergency Medicine

## 2023-09-13 ENCOUNTER — Emergency Department (HOSPITAL_BASED_OUTPATIENT_CLINIC_OR_DEPARTMENT_OTHER)
Admission: EM | Admit: 2023-09-13 | Discharge: 2023-09-13 | Disposition: A | Discharge status: Home / Self Care | Payer: Worker's Compensation | Attending: Emergency Medicine | Admitting: Emergency Medicine

## 2023-09-13 ENCOUNTER — Emergency Department (HOSPITAL_BASED_OUTPATIENT_CLINIC_OR_DEPARTMENT_OTHER): Payer: Managed Care, Other (non HMO)

## 2023-09-13 ENCOUNTER — Other Ambulatory Visit (HOSPITAL_BASED_OUTPATIENT_CLINIC_OR_DEPARTMENT_OTHER): Payer: Self-pay | Admitting: Emergency Medicine

## 2023-09-13 ENCOUNTER — Encounter (HOSPITAL_BASED_OUTPATIENT_CLINIC_OR_DEPARTMENT_OTHER): Payer: Self-pay

## 2023-09-13 DIAGNOSIS — M7989 Other specified soft tissue disorders: Secondary | ICD-10-CM

## 2023-09-13 DIAGNOSIS — M79672 Pain in left foot: Secondary | ICD-10-CM | POA: Insufficient documentation

## 2023-09-13 NOTE — ED Triage Notes (Signed)
Pt c/o injury at work on 2/7. Advises he went back for xray yesterday (2/14), was advised that "foot was really cold & couldn't get a good pulse on it."  Advises intermittent numbness  States 100lb box fell on foot, fx 3rd metatarsal.  Pulse found via doppler, marked w skin pen

## 2023-09-13 NOTE — ED Provider Notes (Signed)
[place] [facility] DE[facility] AT [facility] Provider Note   CSN: [id number] Arrival date & time: 09/13/23  0139     History  Chief Complaint  Patient presents with   Foot Injury    Eric Cowan is a 33 y.o. male.  33 year old male who presents ER today secondary to persistent left foot pain and swelling.  Someone did an exam and thought that he had no pulse there and wanted him to come here to be evaluated for DVT.  Injury happened 10 days ago.  Patient poorly has some type of "small fracture" in a walking boot.  Also has crutches.  Swelling has improved.  But has intermittent episodes of pain and tingling.   Foot Injury      Home Medications Prior to Admission medications   Medication Sig Start Date End Date Taking? Authorizing Provider  Amphetamine-Dextroamphetamine (ADDERALL PO) Take by mouth.    [provider]  atorvastatin (LIPITOR) 10 MG tablet Take by mouth. 02/09/19   [provider]  dexamethasone (DECADRON) 4 MG tablet Take one table every other day as needed for pain not controlled with other medications 08/01/23   Cardama, Amadeo Garnet, MD  HYDROCODONE-ACETAMINOPHEN PO Take by mouth.    [provider]  lidocaine (LIDODERM) 5 % Place 1 patch onto the skin daily. Remove & Discard patch within 12 hours or as directed by MD 01/10/21   Henderly, Britni A, PA-C  meloxicam (MOBIC) 15 MG tablet Take 1 tablet (15 mg total) by mouth daily. 03/13/20   Felecia Shelling, DPM  methocarbamol (ROBAXIN) 500 MG tablet Take 1-2 tablets (500-1,000 mg total) by mouth every 8 (eight) hours as needed for muscle spasms. 08/01/23   Nira Conn, MD  metoCLOPramide (REGLAN) 10 MG tablet Take 1 tablet (10 mg total) by mouth every 6 (six) hours. 02/26/22   Janell Quiet, PA-C      Allergies    Patient has no known allergies.    Review of Systems   Review of Systems  Physical Exam Updated Vital Signs BP (!) 128/91   Pulse 84   Temp  97.9 F (36.6 C)   Resp 16   SpO2 98%  Physical Exam Vitals and nursing note reviewed.  Constitutional:      Appearance: He is well-developed.  HENT:     Head: Normocephalic and atraumatic.  Cardiovascular:     Rate and Rhythm: Normal rate.  Pulmonary:     Effort: Pulmonary effort is normal. No respiratory distress.  Abdominal:     General: There is no distension.  Musculoskeletal:        General: Tenderness (With mild edema over the dorsal surface of his left foot.  Palpable pulse.  Good cap refill.  Good sensation to light touch.) present. Normal range of motion.     Cervical back: Normal range of motion.  Skin:    General: Skin is warm and dry.  Neurological:     Mental Status: He is alert.     ED Results / Procedures / Treatments   Labs (all labs ordered are listed, but only abnormal results are displayed) Labs Reviewed - No data to display  EKG None  Radiology DG Foot Complete Left Result Date: 09/13/2023 CLINICAL DATA:  Work injury 09/05/2023 with mention of third metatarsal fracture. EXAM: LEFT FOOT - COMPLETE 3 VIEW COMPARISON:  03/13/2020 FINDINGS: There is no evidence of fracture or dislocation. Stable appearance of the midfoot on the lateral view compared to  2021. There is no evidence of arthropathy or other focal bone abnormality. Soft tissues are unremarkable. IMPRESSION: Stable and negative foot radiograph. Electronically Signed   By: Tiburcio Pea M.D.   On: 09/13/2023 05:06    Procedures Procedures    Medications Ordered in ED Medications - No data to display  ED Course/ Medical Decision Making/ A&P                                 Medical Decision Making Amount and/or Complexity of Data Reviewed Radiology: ordered.   Suspect diminished Dp pulse related to edema. Has symmetric cap refill. Good neuro exam as well. Xr ok. Already has crutches/boot. No indication for further workup at this time. Doubt dvt but apparently someone really wanted one so  will come back to get it later in day. To be scheduled by nursing and patient prior to d/c.    Final Clinical Impression(s) / ED Diagnoses Final diagnoses:  Left foot pain  Foot swelling    Rx / DC Orders ED Discharge Orders          Ordered    US Venous Img Lower Unilateral Left        09/13/23 0446              Kandice Schmelter, Barbara Cower, MD 09/13/23 [phone]

## 2023-10-13 ENCOUNTER — Ambulatory Visit: Admission: EM | Admit: 2023-10-13 | Discharge: 2023-10-13 | Disposition: A | Discharge status: Home / Self Care

## 2023-10-13 ENCOUNTER — Encounter: Payer: Self-pay | Admitting: *Deleted

## 2023-10-13 ENCOUNTER — Other Ambulatory Visit: Payer: Self-pay

## 2023-10-13 DIAGNOSIS — J101 Influenza due to other identified influenza virus with other respiratory manifestations: Secondary | ICD-10-CM

## 2023-10-13 LAB — POC COVID19/FLU A&B COMBO
Covid Antigen, POC: NEGATIVE
Influenza A Antigen, POC: POSITIVE — AB
Influenza B Antigen, POC: NEGATIVE

## 2023-10-13 MED ORDER — OSELTAMIVIR PHOSPHATE 75 MG PO CAPS: 75.0000 mg | ORAL_CAPSULE | Freq: Two times a day (BID) | ORAL | 0 refills | Status: AC

## 2023-10-13 NOTE — ED Provider Notes (Signed)
 [facility]    CSN: [id number] Arrival date & time: 10/13/23  1151      History   Chief Complaint Chief Complaint  Patient presents with   Cough    HPI Eric Cowan is a 33 y.o. male.   Patient presents with cough, nasal congestion, generalized bodyaches, headache, sore throat that started yesterday.  Denies any obvious known sick contacts.  Denies any documented fever at home. Reports that he has taken cold and flu medications.   Cough   Past Medical History:  Diagnosis Date   ADHD    Back pain    Migraines    Tonsillitis     Patient Active Problem List   Diagnosis Date Noted   Hernia 03/13/2020   Other physical therapy 03/13/2020   Sprain of left ankle 11/01/2019   Tonsillitis 09/22/2019   DDD (degenerative disc disease), lumbosacral 10/12/2013   Low back pain 10/11/2013   Appendicitis 02/15/2013   Hematuria 11/29/2011    Past Surgical History:  Procedure Laterality Date   APPENDECTOMY         Home Medications    Prior to Admission medications   Medication Sig Start Date End Date Taking? Authorizing Provider  baclofen (LIORESAL) 10 MG tablet Take 10 mg by mouth 3 (three) times daily. 08/28/23 11/26/23 Yes [provider]  FLUoxetine (PROZAC) 10 MG capsule Take 10 mg by mouth daily. 10/06/23  Yes [provider]  lisdexamfetamine (VYVANSE) 40 MG capsule Take 40 mg by mouth every morning. 10/06/23  Yes [provider]  meloxicam (MOBIC) 15 MG tablet Take 1 tablet (15 mg total) by mouth daily. 03/13/20  Yes Felecia Shelling, DPM  oseltamivir (TAMIFLU) 75 MG capsule Take 1 capsule (75 mg total) by mouth every 12 (twelve) hours. 10/13/23  Yes Ivonne Freeburg, Rolly Salter E, FNP  propranolol ER (INDERAL LA) 60 MG 24 hr capsule Take 60 mg by mouth daily.   Yes [provider]  rizatriptan (MAXALT-MLT) 10 MG disintegrating tablet Take 10 mg by mouth as needed. 07/01/23  Yes [provider]  temazepam (RESTORIL) 15  MG capsule Take 15 mg by mouth at bedtime as needed. 08/25/23  Yes [provider]  Amphetamine-Dextroamphetamine (ADDERALL PO) Take by mouth. Patient not taking: Reported on 10/13/2023    [provider]  atorvastatin (LIPITOR) 10 MG tablet Take by mouth. Patient not taking: Reported on 10/13/2023 02/09/19   [provider]  dexamethasone (DECADRON) 4 MG tablet Take one table every other day as needed for pain not controlled with other medications Patient not taking: Reported on 10/13/2023 08/01/23   Nira Conn, MD  HYDROCODONE-ACETAMINOPHEN PO Take by mouth. Patient not taking: Reported on 10/13/2023    [provider]  lidocaine (LIDODERM) 5 % Place 1 patch onto the skin daily. Remove & Discard patch within 12 hours or as directed by MD Patient not taking: Reported on 10/13/2023 01/10/21   Henderly, Britni A, PA-C  methocarbamol (ROBAXIN) 500 MG tablet Take 1-2 tablets (500-1,000 mg total) by mouth every 8 (eight) hours as needed for muscle spasms. Patient not taking: Reported on 10/13/2023 08/01/23   Nira Conn, MD  metoCLOPramide (REGLAN) 10 MG tablet Take 1 tablet (10 mg total) by mouth every 6 (six) hours. Patient not taking: Reported on 10/13/2023 02/26/22   Janell Quiet, PA-C    Family History Family History  Problem Relation Age of Onset   Healthy Mother    Healthy Father  Social History Social History   Tobacco Use   Smoking status: Never   Smokeless tobacco: Never  Vaping Use   Vaping status: Never Used  Substance Use Topics   Alcohol use: Yes    Comment: occasionally   Drug use: Never     Allergies   Patient has no known allergies.   Review of Systems Review of Systems Per HPI  Physical Exam Triage Vital Signs ED Triage Vitals  Encounter Vitals Group     BP 10/13/23 1252 126/83     Systolic BP Percentile --      Diastolic BP Percentile --      Pulse Rate 10/13/23 1252 (!) 107     Resp 10/13/23 1252  20     Temp 10/13/23 1252 99.3 F (37.4 C)     Temp Source 10/13/23 1252 Oral     SpO2 10/13/23 1252 97 %     Weight --      Height --      Head Circumference --      Peak Flow --      Pain Score 10/13/23 1247 10     Pain Loc --      Pain Education --      Exclude from Growth Chart --    No data found.  Updated Vital Signs BP 126/83 (BP Location: Right Arm)   Pulse (!) 107   Temp 99.3 F (37.4 C) (Oral)   Resp 20   SpO2 97%   Visual Acuity Right Eye Distance:   Left Eye Distance:   Bilateral Distance:    Right Eye Near:   Left Eye Near:    Bilateral Near:     Physical Exam Constitutional:      General: He is not in acute distress.    Appearance: Normal appearance. He is not toxic-appearing or diaphoretic.  HENT:     Head: Normocephalic and atraumatic.     Right Ear: Tympanic membrane and ear canal normal.     Left Ear: Tympanic membrane and ear canal normal.     Nose: Congestion present.     Mouth/Throat:     Mouth: Mucous membranes are moist.     Pharynx: Posterior oropharyngeal erythema present.  Eyes:     Extraocular Movements: Extraocular movements intact.     Conjunctiva/sclera: Conjunctivae normal.     Pupils: Pupils are equal, round, and reactive to light.  Cardiovascular:     Rate and Rhythm: Normal rate and regular rhythm.     Pulses: Normal pulses.     Heart sounds: Normal heart sounds.  Pulmonary:     Effort: Pulmonary effort is normal. No respiratory distress.     Breath sounds: Normal breath sounds. No stridor. No wheezing, rhonchi or rales.  Abdominal:     General: Bowel sounds are normal. There is no distension.     Palpations: Abdomen is soft.     Tenderness: There is no abdominal tenderness.  Musculoskeletal:        General: Normal range of motion.     Cervical back: Normal range of motion.  Skin:    General: Skin is warm and dry.  Neurological:     General: No focal deficit present.     Mental Status: He is alert and oriented to  person, place, and time. Mental status is at baseline.  Psychiatric:        Mood and Affect: Mood normal.        Behavior: Behavior normal.  UC Treatments / Results  Labs (all labs ordered are listed, but only abnormal results are displayed) Labs Reviewed  POC COVID19/FLU A&B COMBO - Abnormal; Notable for the following components:      Result Value   Influenza A Antigen, POC Positive (*)    All other components within normal limits    EKG   Radiology No results found.  Procedures Procedures (including critical care time)  Medications Ordered in UC Medications - No data to display  Initial Impression / Assessment and Plan / UC Course  I have reviewed the triage vital signs and the nursing notes.  Pertinent labs & imaging results that were available during my care of the patient were reviewed by me and considered in my medical decision making (see chart for details).     Patient tested positive for influenza A..  Will treat with Tamiflu.  Advised adequate fluids, rest, supportive care, symptom management.  Advised patient to follow-up if any symptoms persist or worsen.  Patient verbalized understanding and was agreeable with plan. Final Clinical Impressions(s) / UC Diagnoses   Final diagnoses:  Influenza A     Discharge Instructions      I have prescribed you Tamiflu because you tested positive for the flu.  Follow-up if any symptoms persist or worsen.  Ensure adequate fluids and rest.    ED Prescriptions     Medication Sig Dispense Auth. Provider   oseltamivir (TAMIFLU) 75 MG capsule Take 1 capsule (75 mg total) by mouth every 12 (twelve) hours. 10 capsule Gustavus Bryant, [place]      PDMP not reviewed this encounter.   Gustavus Bryant, [place] 10/13/23 1400

## 2023-10-13 NOTE — Discharge Instructions (Signed)
 I have prescribed you Tamiflu because you tested positive for the flu.  Follow-up if any symptoms persist or worsen.  Ensure adequate fluids and rest.

## 2023-10-13 NOTE — ED Triage Notes (Signed)
 Cough, body aches, sore throat, headaches, n/v x 2 days. Vomited x 3 last night. He took meloxicam and muscle relaxers this morning

## 2023-10-14 ENCOUNTER — Ambulatory Visit: Payer: Self-pay

## 2023-12-23 ENCOUNTER — Encounter (HOSPITAL_BASED_OUTPATIENT_CLINIC_OR_DEPARTMENT_OTHER): Payer: Self-pay

## 2023-12-23 ENCOUNTER — Emergency Department (HOSPITAL_BASED_OUTPATIENT_CLINIC_OR_DEPARTMENT_OTHER): Admitting: Radiology

## 2023-12-23 ENCOUNTER — Other Ambulatory Visit: Payer: Self-pay

## 2023-12-23 ENCOUNTER — Emergency Department (HOSPITAL_BASED_OUTPATIENT_CLINIC_OR_DEPARTMENT_OTHER)
Admission: EM | Admit: 2023-12-23 | Discharge: 2023-12-23 | Disposition: A | Discharge status: Home / Self Care | Attending: Emergency Medicine | Admitting: Emergency Medicine

## 2023-12-23 DIAGNOSIS — M545 Low back pain, unspecified: Secondary | ICD-10-CM | POA: Diagnosis not present

## 2023-12-23 DIAGNOSIS — R0602 Shortness of breath: Secondary | ICD-10-CM | POA: Diagnosis not present

## 2023-12-23 DIAGNOSIS — R0789 Other chest pain: Secondary | ICD-10-CM | POA: Diagnosis present

## 2023-12-23 LAB — HEPATIC FUNCTION PANEL
ALT: 34 U/L (ref 0–44)
AST: 25 U/L (ref 15–41)
Albumin: 4.5 g/dL (ref 3.5–5.0)
Alkaline Phosphatase: 75 U/L (ref 38–126)
Bilirubin, Direct: 0.2 mg/dL (ref 0.0–0.2)
Indirect Bilirubin: 0.4 mg/dL (ref 0.3–0.9)
Total Bilirubin: 0.6 mg/dL (ref 0.0–1.2)
Total Protein: 8.2 g/dL — ABNORMAL HIGH (ref 6.5–8.1)

## 2023-12-23 LAB — BASIC METABOLIC PANEL WITH GFR
Anion gap: 12 (ref 5–15)
BUN: 10 mg/dL (ref 6–20)
CO2: 26 mmol/L (ref 22–32)
Calcium: 10.4 mg/dL — ABNORMAL HIGH (ref 8.9–10.3)
Chloride: 102 mmol/L (ref 98–111)
Creatinine, Ser: 0.83 mg/dL (ref 0.61–1.24)
GFR, Estimated: >60 mL/min (ref >60)
Glucose, Bld: 100 mg/dL — ABNORMAL HIGH (ref 70–99)
Potassium: 4 mmol/L (ref 3.5–5.1)
Sodium: 141 mmol/L (ref 135–145)

## 2023-12-23 LAB — CBC
HCT: 50.6 % (ref 39.0–52.0)
Hemoglobin: 17 g/dL (ref 13.0–17.0)
MCH: 29.9 pg (ref 26.0–34.0)
MCHC: 33.6 g/dL (ref 30.0–36.0)
MCV: 89.1 fL (ref 80.0–100.0)
Platelets: 246 10*3/uL (ref 150–400)
RBC: 5.68 MIL/uL (ref 4.22–5.81)
RDW: 12.2 % (ref 11.5–15.5)
WBC: 9.5 10*3/uL (ref 4.0–10.5)
nRBC: 0 % (ref 0.0–0.2)

## 2023-12-23 LAB — TROPONIN T, HIGH SENSITIVITY
Troponin T High Sensitivity: <15 ng/L (ref <19)
Troponin T High Sensitivity: <15 ng/L (ref <19)

## 2023-12-23 LAB — LIPASE, BLOOD: Lipase: 21 U/L (ref 11–51)

## 2023-12-23 LAB — D-DIMER, QUANTITATIVE: D-Dimer, Quant: 0.34 ug{FEU}/mL (ref 0.00–0.50)

## 2023-12-23 MED ORDER — ACETAMINOPHEN 325 MG PO TABS
650.0000 mg | ORAL_TABLET | Freq: Once | ORAL | Status: AC
  Administered 2023-12-23: 650 mg via ORAL
  Filled 2023-12-23: qty 2

## 2023-12-23 MED ORDER — CYCLOBENZAPRINE HCL 5 MG PO TABS
5.0000 mg | ORAL_TABLET | Freq: Once | ORAL | Status: AC
  Administered 2023-12-23: 5 mg via ORAL
  Filled 2023-12-23: qty 1

## 2023-12-23 MED ORDER — KETOROLAC TROMETHAMINE 30 MG/ML IJ SOLN
30.0000 mg | Freq: Once | INTRAMUSCULAR | Status: AC
  Administered 2023-12-23: 30 mg via INTRAVENOUS
  Filled 2023-12-23: qty 1

## 2023-12-23 MED ORDER — CYCLOBENZAPRINE HCL 5 MG PO TABS: 5.0000 mg | ORAL_TABLET | Freq: Two times a day (BID) | ORAL | 0 refills | Status: AC | PRN

## 2023-12-23 MED ORDER — ONDANSETRON HCL 4 MG/2ML IJ SOLN
4.0000 mg | Freq: Once | INTRAMUSCULAR | Status: AC
  Administered 2023-12-23: 4 mg via INTRAVENOUS
  Filled 2023-12-23: qty 2

## 2023-12-23 NOTE — ED Provider Notes (Signed)
 [facility] Provider Note   CSN: [id number] Arrival date & time: 12/23/23  1401     History  Chief Complaint  Patient presents with   Chest Pain    Eric Cowan is a 33 y.o. male.  -Chest pain since yesterday evening.  Shortness of breath.  She has had nerve ablation to his low back couple days ago.  Has been taking narcotics for this.  He has a history of migraines back pain ADHD.  Denies any cough sputum production fever chills.  He has been having some left-sided chest pain shortness of breath at times.  Denies any smoking history.  No significant cardiac history in the family.  No history of hypertension high cholesterol diabetes.  Movement seems to make it worse.  Denies any loss of bowel or bladder.  Denies any weakness numbness in his legs.  He is having a little bit of discomfort in his low back otherwise.  The history is provided by the patient.       Home Medications Prior to Admission medications   Medication Sig Start Date End Date Taking? Authorizing Provider  ALPRAZolam (XANAX) 0.25 MG tablet Take 0.25 mg by mouth as needed. *panic attack* 12/23/23  Yes [provider]  cyclobenzaprine  (FLEXERIL ) 5 MG tablet Take 1 tablet (5 mg total) by mouth 2 (two) times daily as needed for up to 20 doses for muscle spasms. 12/23/23  Yes Angellee Cohill, DO  dexamethasone  (DECADRON ) 4 MG tablet Take one table every other day as needed for pain not controlled with other medications Patient not taking: Reported on 10/13/2023 08/01/23   Lindle Rhea, MD  FLUoxetine (PROZAC) 10 MG capsule Take 10 mg by mouth daily. 10/06/23   [provider]  lidocaine  (LIDODERM ) 5 % Place 1 patch onto the skin daily. Remove & Discard patch within 12 hours or as directed by MD Patient not taking: Reported on 10/13/2023 01/10/21   Henderly, Britni A, PA-C  lisdexamfetamine (VYVANSE) 40 MG capsule Take 40 mg by mouth every morning.  10/06/23   [provider]  meloxicam  (MOBIC ) 15 MG tablet Take 1 tablet (15 mg total) by mouth daily. 03/13/20   Dot Gazella, DPM  methocarbamol  (ROBAXIN ) 500 MG tablet Take 1-2 tablets (500-1,000 mg total) by mouth every 8 (eight) hours as needed for muscle spasms. Patient not taking: Reported on 10/13/2023 08/01/23   Lindle Rhea, MD  metoCLOPramide  (REGLAN ) 10 MG tablet Take 1 tablet (10 mg total) by mouth every 6 (six) hours. Patient not taking: Reported on 10/13/2023 02/26/22   Conklin, Erica R, PA-C  oseltamivir  (TAMIFLU ) 75 MG capsule Take 1 capsule (75 mg total) by mouth every 12 (twelve) hours. Patient not taking: Reported on 12/23/2023 10/13/23   Dodson Freestone, FNP  oxyCODONE -acetaminophen  (PERCOCET/ROXICET) 5-325 MG tablet Take 1 tablet by mouth every 6 (six) hours as needed. 12/18/23 12/23/23  [provider]  propranolol ER (INDERAL LA) 60 MG 24 hr capsule Take 60 mg by mouth daily.    [provider]  rizatriptan (MAXALT-MLT) 10 MG disintegrating tablet Take 10 mg by mouth as needed. 07/01/23   [provider]  temazepam (RESTORIL) 15 MG capsule Take 15 mg by mouth at bedtime as needed. 08/25/23   [provider]      Allergies    Patient has no known allergies.    Review of Systems   Review of Systems  Physical Exam Updated Vital Signs BP Eric Cowan)  145/106 (BP Location: Right Arm)   Pulse 89   Temp 98.2 F (36.8 C) (Oral)   Resp 18   Ht 6\' 1"  (1.854 m)   Wt 127 kg   SpO2 96%   BMI 36.94 kg/m  Physical Exam Vitals and nursing note reviewed.  Constitutional:      General: He is not in acute distress.    Appearance: He is well-developed. He is not ill-appearing.  HENT:     Head: Normocephalic and atraumatic.  Eyes:     Extraocular Movements: Extraocular movements intact.     Conjunctiva/sclera: Conjunctivae normal.     Pupils: Pupils are equal, round, and reactive to light.  Cardiovascular:     Rate and Rhythm: Normal  rate and regular rhythm.     Pulses:          Radial pulses are 2+ on the right side and 2+ on the left side.       Dorsalis pedis pulses are 2+ on the right side and 2+ on the left side.     Heart sounds: Normal heart sounds. No murmur heard. Pulmonary:     Effort: Pulmonary effort is normal. No respiratory distress.     Breath sounds: Normal breath sounds.  Chest:     Chest wall: Tenderness present.  Abdominal:     Palpations: Abdomen is soft.     Tenderness: There is no abdominal tenderness.  Musculoskeletal:        General: No swelling. Normal range of motion.     Cervical back: Normal range of motion and neck supple.     Right lower leg: No edema.     Left lower leg: No edema.  Skin:    General: Skin is warm and dry.     Capillary Refill: Capillary refill takes less than 2 seconds.  Neurological:     General: No focal deficit present.     Mental Status: He is alert.     Cranial Nerves: No cranial nerve deficit.     Motor: No weakness.     Comments: Normal strength and sensation throughout  Psychiatric:        Mood and Affect: Mood normal.     ED Results / Procedures / Treatments   Labs (all labs ordered are listed, but only abnormal results are displayed) Labs Reviewed  BASIC METABOLIC PANEL WITH GFR - Abnormal; Notable for the following components:      Result Value   Glucose, Bld 100 (*)    Calcium 10.4 (*)    All other components within normal limits  HEPATIC FUNCTION PANEL - Abnormal; Notable for the following components:   Total Protein 8.2 (*)    All other components within normal limits  CBC  LIPASE, BLOOD  D-DIMER, QUANTITATIVE  TROPONIN T, HIGH SENSITIVITY  TROPONIN T, HIGH SENSITIVITY    EKG EKG Interpretation Date/Time:  Tuesday Dec 23 2023 14:09:36 EDT Ventricular Rate:  101 PR Interval:  122 QRS Duration:  89 QT Interval:  349 QTC Calculation: 453 R Axis:   63  Text Interpretation: Sinus tachycardia Confirmed by Lowery Rue [phone]) on  12/23/2023 3:03:13 PM  Radiology DG Chest 2 View Result Date: 12/23/2023 CLINICAL DATA:  Chest pain EXAM: CHEST - 2 VIEW COMPARISON:  01/23/2023 CT FINDINGS: The heart size and mediastinal contours are within normal limits. Both lungs are clear. The visualized skeletal structures are unremarkable. IMPRESSION: No active cardiopulmonary disease. Electronically Signed   By: Janeece Mechanic M.D.  On: 12/23/2023 16:53    Procedures Procedures    Medications Ordered in ED Medications  ketorolac  (TORADOL ) 30 MG/ML injection 30 mg (has no administration in time range)  cyclobenzaprine  (FLEXERIL ) tablet 5 mg (has no administration in time range)  ondansetron  (ZOFRAN ) injection 4 mg (4 mg Intravenous Given 12/23/23 1635)  acetaminophen  (TYLENOL ) tablet 650 mg (650 mg Oral Given 12/23/23 1635)    ED Course/ Medical Decision Making/ A&P                                 Medical Decision Making Amount and/or Complexity of Data Reviewed Labs: ordered. Radiology: ordered.  Risk OTC drugs. Prescription drug management.   Candis Chamberlain is here with chest pain.  Unremarkable vitals.  No fever.  Just had spinal ablation done to his L5-S1 couple days ago.  Started having some chest wall pain here the last 2 days.  Taking narcotic pain medicine without much relief.  Has been dealing with some back discomfort.  But denies any loss of bowel or bladder.  No numbness no weakness no tingling.  He had his procedure done before.  He has some general soreness in the left lower back but no real tenderness on exam.  He is tender over the left anterior chest wall worse when I move his left arm.  He is Clear breath sounds.  Denies any cough or sputum production.  Denies any cardiac history.  Heart score essentially 1.  He has no history of diabetes hypertension high cholesterol.  No significant family history.  Differential diagnosis likely MSK related pain but will check troponin D-dimer basic labs chest x-ray  EKG shows sinus rhythm.  No ischemic changes per my review interpretation of labs.  Could be GI related but less likely.  I have much lower concern for cholecystitis or pancreatitis.  Will check lipase CMP.  Per my review interpretation of labs troponin negative x 2.  D-dimer negative.  Chest x-ray no evidence of pneumonia or pneumothorax.  No significant leukocytosis anemia or electrolyte abnormality kidney injury otherwise.  I do think that this is likely MSK related pain.  Will prescribe Flexeril .  Given Toradol  and Flexeril  here in the ED.  He will continue narcotic pain medicine at home.  Follow-up with primary care doctor.  Return if symptoms worsen.  This chart was dictated using voice recognition software.  Despite best efforts to proofread,  errors can occur which can change the documentation meaning.         Final Clinical Impression(s) / ED Diagnoses Final diagnoses:  Atypical chest pain    Rx / DC Orders ED Discharge Orders          Ordered    cyclobenzaprine  (FLEXERIL ) 5 MG tablet  2 times daily PRN        12/23/23 1707              Lowery Rue, DO 12/23/23 1707

## 2023-12-23 NOTE — ED Triage Notes (Signed)
 In for eval of chest tightness and SOB intermittent for the last several hours. Had episode of SOB on Sunday and chest tightness onset yesterday with dizziness and lightheadedness that resolved around midnight. Nausea and dry heaving. Denies vomiting. Had L5-S1 nerve ablasion on 12/19/2023.

## 2023-12-23 NOTE — Discharge Instructions (Signed)
 Continue Tylenol  1000 mg every 6 hours as needed for pain.  Continue your narcotic pain medicine as prescribed.  I have substituted Robaxin  for Flexeril  for muscle relaxant.

## 2023-12-23 NOTE — ED Notes (Signed)
 RN reviewed discharge instructions with pt. Pt verbalized understanding and had no further questions. VSS upon discharge.
